# Patient Record
Sex: Male | Born: 1970 | Race: Black or African American | Hispanic: No | Marital: Married | Smoking: Current every day smoker
Health system: Southern US, Community
[De-identification: ages and names within clinical notes are randomized; demographics above are authoritative.]

## PROBLEM LIST (undated history)

## (undated) DIAGNOSIS — G43909 Migraine, unspecified, not intractable, without status migrainosus: Secondary | ICD-10-CM

## (undated) DIAGNOSIS — B019 Varicella without complication: Secondary | ICD-10-CM

## (undated) DIAGNOSIS — I517 Cardiomegaly: Secondary | ICD-10-CM

## (undated) DIAGNOSIS — I499 Cardiac arrhythmia, unspecified: Secondary | ICD-10-CM

## (undated) DIAGNOSIS — I10 Essential (primary) hypertension: Secondary | ICD-10-CM

## (undated) DIAGNOSIS — G4733 Obstructive sleep apnea (adult) (pediatric): Secondary | ICD-10-CM

## (undated) DIAGNOSIS — M1990 Unspecified osteoarthritis, unspecified site: Secondary | ICD-10-CM

## (undated) DIAGNOSIS — E7800 Pure hypercholesterolemia, unspecified: Secondary | ICD-10-CM

## (undated) HISTORY — DX: Migraine, unspecified, not intractable, without status migrainosus: G43.909

## (undated) HISTORY — DX: Cardiac arrhythmia, unspecified: I49.9

## (undated) HISTORY — DX: Varicella without complication: B01.9

## (undated) HISTORY — DX: Pure hypercholesterolemia, unspecified: E78.00

## (undated) HISTORY — DX: Obstructive sleep apnea (adult) (pediatric): G47.33

## (undated) HISTORY — PX: CARPAL TUNNEL RELEASE: SHX101

## (undated) HISTORY — PX: KNEE ARTHROPLASTY: SHX992

## (undated) HISTORY — DX: Unspecified osteoarthritis, unspecified site: M19.90

---

## 2012-03-07 ENCOUNTER — Emergency Department (HOSPITAL_BASED_OUTPATIENT_CLINIC_OR_DEPARTMENT_OTHER)
Admission: EM | Admit: 2012-03-07 | Discharge: 2012-03-08 | Disposition: A | Discharge status: Home / Self Care | Payer: Self-pay | Attending: Emergency Medicine | Admitting: Emergency Medicine

## 2012-03-07 DIAGNOSIS — F17200 Nicotine dependence, unspecified, uncomplicated: Secondary | ICD-10-CM | POA: Insufficient documentation

## 2012-03-07 DIAGNOSIS — X58XXXA Exposure to other specified factors, initial encounter: Secondary | ICD-10-CM | POA: Insufficient documentation

## 2012-03-07 DIAGNOSIS — Y929 Unspecified place or not applicable: Secondary | ICD-10-CM | POA: Insufficient documentation

## 2012-03-07 DIAGNOSIS — Z8679 Personal history of other diseases of the circulatory system: Secondary | ICD-10-CM | POA: Insufficient documentation

## 2012-03-07 DIAGNOSIS — Z79899 Other long term (current) drug therapy: Secondary | ICD-10-CM | POA: Insufficient documentation

## 2012-03-07 DIAGNOSIS — Y9389 Activity, other specified: Secondary | ICD-10-CM | POA: Insufficient documentation

## 2012-03-07 DIAGNOSIS — S025XXA Fracture of tooth (traumatic), initial encounter for closed fracture: Secondary | ICD-10-CM | POA: Insufficient documentation

## 2012-03-07 DIAGNOSIS — K0889 Other specified disorders of teeth and supporting structures: Secondary | ICD-10-CM

## 2012-03-07 DIAGNOSIS — I10 Essential (primary) hypertension: Secondary | ICD-10-CM | POA: Insufficient documentation

## 2012-03-07 HISTORY — DX: Essential (primary) hypertension: I10

## 2012-03-07 HISTORY — DX: Cardiomegaly: I51.7

## 2012-03-07 NOTE — ED Notes (Signed)
Cracked tooth upper left  Now very painful

## 2012-03-08 ENCOUNTER — Encounter (HOSPITAL_BASED_OUTPATIENT_CLINIC_OR_DEPARTMENT_OTHER): Payer: Self-pay | Admitting: Emergency Medicine

## 2012-03-08 MED ORDER — AMOXICILLIN 500 MG PO CAPS: 500.0000 mg | ORAL_CAPSULE | Freq: Three times a day (TID) | ORAL | Status: DC

## 2012-03-08 MED ORDER — HYDROCODONE-ACETAMINOPHEN 5-325 MG PO TABS
2.0000 | ORAL_TABLET | Freq: Once | ORAL | Status: AC
  Administered 2012-03-08: 2 via ORAL
  Filled 2012-03-08: qty 2

## 2012-03-08 MED ORDER — HYDROCODONE-ACETAMINOPHEN 5-325 MG PO TABS: 2.0000 | ORAL_TABLET | ORAL | Status: DC | PRN

## 2012-03-08 NOTE — ED Provider Notes (Signed)
History     CSN: [id number]  Arrival date & time 03/07/12  2330   First MD Initiated Contact with Patient 03/08/12 0001      Chief Complaint  Patient presents with  . Dental Injury    (Consider location/radiation/quality/duration/timing/severity/associated sxs/prior treatment) HPI Comments: 42 year old male with a history of right upper first premolar dental fracture which occurred several months ago. He states that since that time he has been having intermittent pain which is sharp and shooting radiating up into his temple and down into his neck. The pain goes away with Naprosyn but comes back when the Naprosyn wears off. He has had no swelling, no objective fevers, nausea, vomiting, difficulty swallowing, difficulty with speech or breathing. He has contacted a dentist but has not been scheduled for a followup appointment at this time. Currently his symptoms are moderate, worse with palpation  Patient is a 42 y.o. male presenting with dental injury. The history is provided by the patient and the spouse.  Dental Injury    Past Medical History  Diagnosis Date  . Hypertension   . Left ventricular hypertrophy     Past Surgical History  Procedure Date  . Knee arthroplasty     No family history on file.  History  Substance Use Topics  . Smoking status: Current Every Day Smoker  . Smokeless tobacco: Not on file  . Alcohol Use: Yes     Comment: socially      Review of Systems  Constitutional: Negative for fever and chills.  HENT: Positive for dental problem. Negative for sore throat, facial swelling, trouble swallowing and voice change.        Toothache  Gastrointestinal: Negative for nausea and vomiting.    Allergies  Penicillins  Home Medications   Current Outpatient Rx  Name  Route  Sig  Dispense  Refill  . HYDROCHLOROTHIAZIDE 25 MG PO TABS   Oral   Take 25 mg by mouth daily.         . AMOXICILLIN 500 MG PO CAPS   Oral   Take 1 capsule (500 mg total) by  mouth 3 (three) times daily.   30 capsule   0   . HYDROCODONE-ACETAMINOPHEN 5-325 MG PO TABS   Oral   Take 2 tablets by mouth every 4 (four) hours as needed for pain.   10 tablet   0     BP 153/102  Pulse 77  Temp 97.9 F (36.6 C) (Oral)  Resp 20  SpO2 98%  Physical Exam  Nursing note and vitals reviewed. Constitutional: He appears well-developed and well-nourished. No distress.  HENT:  Head: Normocephalic and atraumatic.  Mouth/Throat: Oropharynx is clear and moist. No oropharyngeal exudate.       Dental Disease - several small fractures of the teeth, several missing teeth, right upper first premolar with fracture, no surrounding redness, swelling, mild tenderness over the tooth, no jaw swelling, no tenderness under the tongue  Eyes: Conjunctivae normal are normal. No scleral icterus.  Neck: Normal range of motion. Neck supple. No thyromegaly present.  Cardiovascular: Normal rate and regular rhythm.   Pulmonary/Chest: Effort normal and breath sounds normal.  Lymphadenopathy:    He has no cervical adenopathy.  Neurological: He is alert.  Skin: Skin is warm and dry. No rash noted. He is not diaphoretic.    ED Course  Procedures (including critical care time)  Labs Reviewed - No data to display No results found.   1. Tooth fracture   2. Pain,  dental       MDM  The patient is well-appearing and nontoxic, he has mild hypertension but no fever or tachycardia. He was given pain medication in the form of hydrocodone acetaminophen, amoxicillin for potential infection given the expose route and I have instructed him to follow up very closely with a dentist, to call in the morning for an urgent followup. He states very clearly that he does not have an allergy to amoxicillin. He takes is frequently without any side effects. His wife corroborated this statement.        Vida Roller, MD 03/08/12 [phone]

## 2012-04-25 ENCOUNTER — Emergency Department (HOSPITAL_COMMUNITY)
Admission: EM | Admit: 2012-04-25 | Discharge: 2012-04-26 | Disposition: A | Discharge status: Home / Self Care | Payer: Self-pay | Attending: Emergency Medicine | Admitting: Emergency Medicine

## 2012-04-25 ENCOUNTER — Encounter (HOSPITAL_COMMUNITY): Payer: Self-pay | Admitting: Emergency Medicine

## 2012-04-25 DIAGNOSIS — Z8679 Personal history of other diseases of the circulatory system: Secondary | ICD-10-CM | POA: Insufficient documentation

## 2012-04-25 DIAGNOSIS — F17200 Nicotine dependence, unspecified, uncomplicated: Secondary | ICD-10-CM | POA: Insufficient documentation

## 2012-04-25 DIAGNOSIS — Z79899 Other long term (current) drug therapy: Secondary | ICD-10-CM | POA: Insufficient documentation

## 2012-04-25 DIAGNOSIS — K047 Periapical abscess without sinus: Secondary | ICD-10-CM | POA: Insufficient documentation

## 2012-04-25 DIAGNOSIS — I10 Essential (primary) hypertension: Secondary | ICD-10-CM | POA: Insufficient documentation

## 2012-04-25 MED ORDER — IBUPROFEN 800 MG PO TABS: 800.0000 mg | ORAL_TABLET | Freq: Three times a day (TID) | ORAL | Status: DC | PRN

## 2012-04-25 MED ORDER — HYDROCODONE-ACETAMINOPHEN 5-325 MG PO TABS: 1.0000 | ORAL_TABLET | ORAL | Status: DC | PRN

## 2012-04-25 MED ORDER — CLINDAMYCIN HCL 150 MG PO CAPS: 300.0000 mg | ORAL_CAPSULE | Freq: Four times a day (QID) | ORAL | Status: DC

## 2012-04-25 MED ORDER — HYDROCODONE-ACETAMINOPHEN 5-325 MG PO TABS
2.0000 | ORAL_TABLET | Freq: Once | ORAL | Status: AC
  Administered 2012-04-26: 2 via ORAL
  Filled 2012-04-25: qty 2

## 2012-04-25 NOTE — ED Provider Notes (Signed)
History    This chart was scribed for non-physician practitioner working with Gavin Pound. Oletta Lamas, MD by Leone Payor, ED Scribe. This patient was seen in room WTR5/WTR5 and the patient's care was started at 2245.   CSN: [id number]  Arrival date & time 04/25/12  2245   None     Chief Complaint  Patient presents with  . Dental Pain    The history is provided by the patient. No language interpreter was used.    Justin [name] is a 42 y.o. male who presents to the Emergency Department complaining of ongoing, constant, unchanged dental pain starting 1 week ago. He had a similar problem with the same tooth in January but was unable to follow up with a dentist.  Reports pain is 10/10 currently. He denies trouble swallowing or breathing, chills, fever.   Pt has h/o HTN.  Pt is a current everyday smoker and occasional alcohol user.  Past Medical History  Diagnosis Date  . Hypertension   . Left ventricular hypertrophy     Past Surgical History  Procedure Laterality Date  . Knee arthroplasty      No family history on file.  History  Substance Use Topics  . Smoking status: Current Every Day Smoker  . Smokeless tobacco: Not on file  . Alcohol Use: Yes     Comment: socially      Review of Systems  Constitutional: Negative for fever and chills.  HENT: Positive for dental problem. Negative for sore throat, drooling and trouble swallowing.   Respiratory: Negative for shortness of breath.     Allergies  Penicillins  Home Medications   Current Outpatient Rx  Name  Route  Sig  Dispense  Refill  . amoxicillin (AMOXIL) 500 MG capsule   Oral   Take 1 capsule (500 mg total) by mouth 3 (three) times daily.   30 capsule   0   . hydrochlorothiazide (HYDRODIURIL) 25 MG tablet   Oral   Take 25 mg by mouth daily.         Marland Kitchen HYDROcodone-acetaminophen (NORCO/VICODIN) 5-325 MG per tablet   Oral   Take 2 tablets by mouth every 4 (four) hours as needed for pain.   10 tablet    0     BP 125/75  Pulse 84  Temp(Src) 98 F (36.7 C)  Resp 16  SpO2 96%  Physical Exam  Nursing note and vitals reviewed. Constitutional: He appears well-developed and well-nourished. No distress.  HENT:  Head: Normocephalic and atraumatic.  Mouth/Throat: Uvula is midline and oropharynx is clear and moist.    Neck: Neck supple.  Cardiovascular: Normal rate and regular rhythm.   Pulmonary/Chest: Effort normal and breath sounds normal.  Neurological: He is alert.  Skin: He is not diaphoretic.    ED Course  Procedures (including critical care time)  DIAGNOSTIC STUDIES: Oxygen Saturation is 96% on room air, adequate by my interpretation.    COORDINATION OF CARE: 11:42 PM Discussed treatment plan with pt at bedside and pt agreed to plan.    Labs Reviewed - No data to display No results found.   1. Dental abscess     MDM  Pt with old dental fracture and recurrent infection.  Likely dental abscess.  Pt is afebrile, nontoxic.  D/C with clindamycin, norco, ibuprofen, dental follow up.  Pt advised he absolutely must see dentist.  Discussed diagnosis, treatment plan, and follow up with patient.  Pt given return precautions.  Pt verbalizes understanding and agrees with plan.        ,  I personally performed the services described in this documentation, which was scribed in my presence. The recorded information has been reviewed and is accurate.   Trixie Dredge, PA-C 04/26/12 0002

## 2012-04-25 NOTE — ED Notes (Signed)
Pt c/o dental pain x1 week.  Reports pain 10/10.

## 2012-04-26 NOTE — ED Provider Notes (Signed)
Medical screening examination/treatment/procedure(s) were performed by non-physician practitioner and as supervising physician I was immediately available for consultation/collaboration.  Gavin Pound. Bryndan Bilyk, MD 04/26/12 1454

## 2013-08-12 ENCOUNTER — Ambulatory Visit (INDEPENDENT_AMBULATORY_CARE_PROVIDER_SITE_OTHER): Payer: BC Managed Care – PPO | Admitting: Internal Medicine

## 2013-08-12 ENCOUNTER — Encounter: Payer: Self-pay | Admitting: Internal Medicine

## 2013-08-12 VITALS — BP 112/80 | Temp 98.3°F | Ht 67.5 in | Wt 272.0 lb

## 2013-08-12 DIAGNOSIS — I517 Cardiomegaly: Secondary | ICD-10-CM

## 2013-08-12 DIAGNOSIS — R739 Hyperglycemia, unspecified: Secondary | ICD-10-CM

## 2013-08-12 DIAGNOSIS — R7309 Other abnormal glucose: Secondary | ICD-10-CM

## 2013-08-12 DIAGNOSIS — I10 Essential (primary) hypertension: Secondary | ICD-10-CM

## 2013-08-12 DIAGNOSIS — G4730 Sleep apnea, unspecified: Secondary | ICD-10-CM

## 2013-08-12 DIAGNOSIS — E7800 Pure hypercholesterolemia, unspecified: Secondary | ICD-10-CM

## 2013-08-12 DIAGNOSIS — G4733 Obstructive sleep apnea (adult) (pediatric): Secondary | ICD-10-CM | POA: Insufficient documentation

## 2013-08-12 DIAGNOSIS — G478 Other sleep disorders: Secondary | ICD-10-CM

## 2013-08-12 MED ORDER — HYDROCHLOROTHIAZIDE 25 MG PO TABS: 25.0000 mg | ORAL_TABLET | Freq: Every day | ORAL | Status: DC

## 2013-08-12 MED ORDER — LISINOPRIL 10 MG PO TABS: 10.0000 mg | ORAL_TABLET | Freq: Every day | ORAL | Status: DC

## 2013-08-12 NOTE — Assessment & Plan Note (Signed)
Get records

## 2013-08-12 NOTE — Patient Instructions (Addendum)
Ok to change back but take 10 lisinopril and 25 mg of hctz Check bp readings at least 3 x per week and record . Nutrition  And sleep referral  You will be contacted about this.  Stopping tobacco is good for your health  150 minutes of exercise weeks  ,  Lose weight  To healthy levels. Avoid trans fats and processed foods;  Increase fresh fruits and veges to 5 servings per day. And avoid sweet beverages  Including tea and juice. sugar and sweets make you crave sweets and feel tired. 3500 calories is the energy content of a pound of body weight .Must have a 3500 cal deficit to lose one pound . Thus decrease 500 calorie equivalent per day in food or drink intake / or exercise  for 7 days to lose one pound.  Labs and ROV in about 1-2 months  BRING IN YOUR blood pressure MONITOR to the visit so we can compare readings .and help [place] manage  Medications in the future.     Obesity Obesity is having too much body fat and a body mass index (BMI) of 30 or more. BMI is a number based on your height and weight. The number is an estimate of how much body fat you have. Obesity can happen if you eat more calories than you can burn by exercising or other activity. It can cause major health problems or emergencies.  HOME CARE  Exercise and be active as told by your doctor. Try:  Using stairs when you can.  Parking farther away from store doors.  Gardening, biking, or walking.  Eat healthy foods and drinks that are low in calories. Eat more fruits and vegetables.  Limit fast food, sweets, and snack foods that are made with ingredients that are not natural (processed food).  Eat smaller amounts of food.  Keep a journal and write down what you eat every day. Websites can help with this.  Avoid drinking alcohol. Drink more water and drinks without calories.   Take vitamins and dietary pills (supplements) only as told by your doctor.  Try going to weight-loss support groups or classes to help lessen  stress. Dieticians and counselors may also help. GET HELP RIGHT AWAY IF:  You have chest pain or tightness.  You have trouble breathing or feel short of breath.  You feel weak or have loss of feeling (numbness) in your legs.  You feel confused or have trouble talking.  You have sudden changes in your vision. MAKE SURE YOU:  Understand these instructions.  Will watch your condition.  Will get help right away if you are not doing well or get worse. Document Released: 04/16/2011 Document Reviewed: 04/16/2011 [facility] Patient Information 2015 [place]. This information is not intended to replace advice given to you by your health care provider. Make sure you discuss any questions you have with your health care provider.
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
## 2013-08-13 ENCOUNTER — Telehealth: Payer: Self-pay | Admitting: Internal Medicine

## 2013-08-13 NOTE — Telephone Encounter (Signed)
Relevant patient education mailed to patient.  

## 2013-09-16 ENCOUNTER — Other Ambulatory Visit (INDEPENDENT_AMBULATORY_CARE_PROVIDER_SITE_OTHER): Payer: BC Managed Care – PPO

## 2013-09-16 DIAGNOSIS — IMO0001 Reserved for inherently not codable concepts without codable children: Secondary | ICD-10-CM

## 2013-09-16 DIAGNOSIS — E1165 Type 2 diabetes mellitus with hyperglycemia: Principal | ICD-10-CM

## 2013-09-16 LAB — BASIC METABOLIC PANEL
BUN: 17 mg/dL (ref 6–23)
CALCIUM: 9.9 mg/dL (ref 8.4–10.5)
CO2: 27 mEq/L (ref 19–32)
Chloride: 107 mEq/L (ref 96–112)
Creatinine, Ser: 1.2 mg/dL (ref 0.4–1.5)
GFR: 86.35 mL/min (ref >60.00)
GLUCOSE: 92 mg/dL (ref 70–99)
Potassium: 3.7 mEq/L (ref 3.5–5.1)
SODIUM: 139 meq/L (ref 135–145)

## 2013-09-16 LAB — HEMOGLOBIN A1C: HEMOGLOBIN A1C: 6.2 % (ref 4.6–6.5)

## 2013-09-22 ENCOUNTER — Emergency Department (HOSPITAL_BASED_OUTPATIENT_CLINIC_OR_DEPARTMENT_OTHER)
Admission: EM | Admit: 2013-09-22 | Discharge: 2013-09-23 | Disposition: A | Discharge status: Home / Self Care | Payer: Worker's Compensation | Attending: Emergency Medicine | Admitting: Emergency Medicine

## 2013-09-22 ENCOUNTER — Encounter (HOSPITAL_BASED_OUTPATIENT_CLINIC_OR_DEPARTMENT_OTHER): Payer: Self-pay | Admitting: Emergency Medicine

## 2013-09-22 ENCOUNTER — Emergency Department (HOSPITAL_BASED_OUTPATIENT_CLINIC_OR_DEPARTMENT_OTHER): Payer: Worker's Compensation

## 2013-09-22 DIAGNOSIS — Z79899 Other long term (current) drug therapy: Secondary | ICD-10-CM | POA: Insufficient documentation

## 2013-09-22 DIAGNOSIS — Y9289 Other specified places as the place of occurrence of the external cause: Secondary | ICD-10-CM | POA: Insufficient documentation

## 2013-09-22 DIAGNOSIS — S6980XA Other specified injuries of unspecified wrist, hand and finger(s), initial encounter: Secondary | ICD-10-CM | POA: Diagnosis present

## 2013-09-22 DIAGNOSIS — Z862 Personal history of diseases of the blood and blood-forming organs and certain disorders involving the immune mechanism: Secondary | ICD-10-CM | POA: Diagnosis not present

## 2013-09-22 DIAGNOSIS — Z23 Encounter for immunization: Secondary | ICD-10-CM | POA: Insufficient documentation

## 2013-09-22 DIAGNOSIS — I10 Essential (primary) hypertension: Secondary | ICD-10-CM | POA: Diagnosis not present

## 2013-09-22 DIAGNOSIS — Z880 Allergy status to penicillin: Secondary | ICD-10-CM | POA: Diagnosis not present

## 2013-09-22 DIAGNOSIS — Y9389 Activity, other specified: Secondary | ICD-10-CM | POA: Insufficient documentation

## 2013-09-22 DIAGNOSIS — Z8639 Personal history of other endocrine, nutritional and metabolic disease: Secondary | ICD-10-CM | POA: Insufficient documentation

## 2013-09-22 DIAGNOSIS — F17200 Nicotine dependence, unspecified, uncomplicated: Secondary | ICD-10-CM | POA: Diagnosis not present

## 2013-09-22 DIAGNOSIS — M1710 Unilateral primary osteoarthritis, unspecified knee: Secondary | ICD-10-CM | POA: Insufficient documentation

## 2013-09-22 DIAGNOSIS — S61209A Unspecified open wound of unspecified finger without damage to nail, initial encounter: Secondary | ICD-10-CM | POA: Diagnosis not present

## 2013-09-22 DIAGNOSIS — Y990 Civilian activity done for income or pay: Secondary | ICD-10-CM | POA: Diagnosis not present

## 2013-09-22 DIAGNOSIS — W298XXA Contact with other powered powered hand tools and household machinery, initial encounter: Secondary | ICD-10-CM | POA: Insufficient documentation

## 2013-09-22 DIAGNOSIS — G43909 Migraine, unspecified, not intractable, without status migrainosus: Secondary | ICD-10-CM | POA: Insufficient documentation

## 2013-09-22 DIAGNOSIS — S61431A Puncture wound without foreign body of right hand, initial encounter: Secondary | ICD-10-CM

## 2013-09-22 DIAGNOSIS — Z8619 Personal history of other infectious and parasitic diseases: Secondary | ICD-10-CM | POA: Diagnosis not present

## 2013-09-22 DIAGNOSIS — S6990XA Unspecified injury of unspecified wrist, hand and finger(s), initial encounter: Secondary | ICD-10-CM | POA: Insufficient documentation

## 2013-09-22 DIAGNOSIS — IMO0002 Reserved for concepts with insufficient information to code with codable children: Secondary | ICD-10-CM

## 2013-09-22 NOTE — ED Provider Notes (Signed)
CSN: [id number]     Arrival date & time 09/22/13  2101 History   This chart was scribed for Justin Culton Alfonso Patten, MD by Lowella Petties, ED Scribe. The patient was seen in room MHT13/MHT13. Patient's care was started at 11:55 PM.   Chief Complaint  Patient presents with  . Finger Injury   Patient is a 43 y.o. male presenting with hand pain. The history is provided by the patient. No language interpreter was used.  Hand Pain This is a new problem. The current episode started 6 to 12 hours ago. The problem occurs constantly. The problem has not changed since onset.Pertinent negatives include no chest pain, no abdominal pain, no headaches and no shortness of breath. Nothing aggravates the symptoms. Nothing relieves the symptoms. He has tried nothing for the symptoms. The treatment provided no relief.   HPI Comments: Justin [name] is a 43 y.o. male who presents to the Emergency Department after a drill bit went through the 5th finger on his right hand at 12 hours ago. He reports constant, throbbing, pain in his right 5th finger that he rates as a 6/10. He reports allergy to penicillin.    Past Medical History  Diagnosis Date  . Hypertension     on meds since 1998   . Left ventricular hypertrophy     card work up [facility] ? inc QRS  . Chicken pox     As a child  . Arthritis     Both knees  . Migraines     Triggered by stress/tension  . Arrhythmia   . High cholesterol    Past Surgical History  Procedure Laterality Date  . Knee arthroplasty      left 2013 ligaments    Family History  Problem Relation Age of Onset  . Hypertension Mother   . Other Father     murdered    History  Substance Use Topics  . Smoking status: Current Every Day Smoker  . Smokeless tobacco: Never Used  . Alcohol Use: Yes     Comment: socially    Review of Systems  Respiratory: Negative for shortness of breath.   Cardiovascular: Negative for chest pain.  Gastrointestinal: Negative for abdominal pain.   Skin: Positive for wound.  Neurological: Negative for headaches.  All other systems reviewed and are negative.   Allergies  Penicillins  Home Medications   Prior to Admission medications   Medication Sig Start Date End Date Taking? Authorizing Provider  hydrochlorothiazide (HYDRODIURIL) 25 MG tablet Take 1 tablet (25 mg total) by mouth daily. 08/12/13  Yes Burnis Medin, MD  lisinopril (PRINIVIL,ZESTRIL) 10 MG tablet Take 1 tablet (10 mg total) by mouth daily. 08/12/13  Yes Burnis Medin, MD  HYDROcodone-acetaminophen (NORCO/VICODIN) 5-325 MG per tablet Take 1 tablet by mouth every 4 (four) hours as needed for pain. 04/25/12   Clayton Bibles, PA-C   Triage Vitals: BP 149/93  Pulse 84  Temp(Src) 98.7 F (37.1 C) (Oral)  Resp 18  Ht 5\' 8"  (1.727 m)  Wt 280 lb (127.007 kg)  BMI 42.58 kg/m2  SpO2 99%  Physical Exam  Nursing note and vitals reviewed. Constitutional: He is oriented to person, place, and time. He appears well-developed and well-nourished. No distress.  HENT:  Head: Normocephalic and atraumatic.  Mouth/Throat: Oropharynx is clear and moist.  Eyes: Conjunctivae and EOM are normal. Pupils are equal, round, and reactive to light.  Neck: Normal range of motion. Neck supple. No tracheal deviation present.  Cardiovascular:  Normal rate and intact distal pulses.   Pulmonary/Chest: Effort normal. No respiratory distress.  Abdominal: Soft. Bowel sounds are normal. There is no tenderness. There is no rebound and no guarding.  Musculoskeletal: Normal range of motion. He exhibits no edema.       Right hand: He exhibits normal range of motion, no tenderness, no bony tenderness, normal two-point discrimination, normal capillary refill, no deformity and no swelling. Normal sensation noted. Normal strength noted.       Hands: Puncture wound to the tuft of the right pinkie finger.  Neurological: He is alert and oriented to person, place, and time. He has normal reflexes.  Skin: Skin is  warm and dry.  Psychiatric: He has a normal mood and affect. His behavior is normal.    ED Course  Procedures (including critical care time)  11:59 PM-Discussed treatment plan which includes doxycyline with pt at bedside and pt agreed to plan.   Labs Review Labs Reviewed - No data to display  Imaging Review Dg Finger Little Right  09/22/2013   CLINICAL DATA:  Injured right finger.  EXAM: RIGHT LITTLE FINGER 2+V  COMPARISON:  None.  FINDINGS: The joint spaces are maintained. Moderate degenerative changes at the DIP joint with a large dorsal spur. No acute fracture or radiopaque foreign body.  IMPRESSION: No acute fracture or radiopaque foreign body.   Electronically Signed   By: Kalman Jewels M.D.   On: 09/22/2013 22:02     EKG Interpretation None      MDM   Final diagnoses:  None   Cleansed thoroughly and irrigated wounds are sealed over and scabbed down.    Will not close as is contaminated.  Have started doxycycline BID x 7 days.  No dairy while taking this medication.  Call in am to be seen by Dr. Amedeo Plenty in follow up.  Patient and wife verbalize understanding and to follow up   I personally performed the services described in this documentation, which was scribed in my presence. The recorded information has been reviewed and is accurate.       Cherri Yera Alfonso Patten, MD 09/23/13 0110

## 2013-09-22 NOTE — ED Notes (Addendum)
Pt was at work and a drill bit through his right hand, 5th finger. Bleeding controlled

## 2013-09-23 ENCOUNTER — Encounter: Payer: BC Managed Care – PPO | Attending: Internal Medicine | Admitting: Dietician

## 2013-09-23 ENCOUNTER — Encounter: Payer: Self-pay | Admitting: Dietician

## 2013-09-23 ENCOUNTER — Encounter (HOSPITAL_BASED_OUTPATIENT_CLINIC_OR_DEPARTMENT_OTHER): Payer: Self-pay | Admitting: Emergency Medicine

## 2013-09-23 ENCOUNTER — Encounter: Payer: Self-pay | Admitting: Internal Medicine

## 2013-09-23 ENCOUNTER — Ambulatory Visit (INDEPENDENT_AMBULATORY_CARE_PROVIDER_SITE_OTHER): Payer: BC Managed Care – PPO | Admitting: Internal Medicine

## 2013-09-23 VITALS — BP 120/86 | Temp 98.0°F | Ht 68.0 in | Wt 278.0 lb

## 2013-09-23 DIAGNOSIS — Z713 Dietary counseling and surveillance: Secondary | ICD-10-CM | POA: Diagnosis present

## 2013-09-23 DIAGNOSIS — R739 Hyperglycemia, unspecified: Secondary | ICD-10-CM

## 2013-09-23 DIAGNOSIS — M7661 Achilles tendinitis, right leg: Secondary | ICD-10-CM

## 2013-09-23 DIAGNOSIS — Z6841 Body Mass Index (BMI) 40.0 and over, adult: Secondary | ICD-10-CM | POA: Insufficient documentation

## 2013-09-23 DIAGNOSIS — I10 Essential (primary) hypertension: Secondary | ICD-10-CM

## 2013-09-23 DIAGNOSIS — M7660 Achilles tendinitis, unspecified leg: Secondary | ICD-10-CM

## 2013-09-23 DIAGNOSIS — R7309 Other abnormal glucose: Secondary | ICD-10-CM | POA: Insufficient documentation

## 2013-09-23 DIAGNOSIS — I517 Cardiomegaly: Secondary | ICD-10-CM

## 2013-09-23 MED ORDER — DOXYCYCLINE HYCLATE 100 MG PO TABS
100.0000 mg | ORAL_TABLET | Freq: Once | ORAL | Status: AC
  Administered 2013-09-23: 100 mg via ORAL
  Filled 2013-09-23: qty 1

## 2013-09-23 MED ORDER — TRAMADOL HCL 50 MG PO TABS: 50.0000 mg | ORAL_TABLET | Freq: Four times a day (QID) | ORAL | Status: DC | PRN

## 2013-09-23 MED ORDER — TETANUS-DIPHTH-ACELL PERTUSSIS 5-2.5-18.5 LF-MCG/0.5 IM SUSP
0.5000 mL | Freq: Once | INTRAMUSCULAR | Status: AC
Start: 2013-09-23 — End: 2013-09-23
  Administered 2013-09-23: 0.5 mL via INTRAMUSCULAR
  Filled 2013-09-23: qty 0.5

## 2013-09-23 MED ORDER — IBUPROFEN 800 MG PO TABS
800.0000 mg | ORAL_TABLET | Freq: Once | ORAL | Status: AC
  Administered 2013-09-23: 800 mg via ORAL
  Filled 2013-09-23: qty 1

## 2013-09-23 MED ORDER — MELOXICAM 7.5 MG PO TABS: 7.5000 mg | ORAL_TABLET | Freq: Every day | ORAL | Status: DC

## 2013-09-23 MED ORDER — DOXYCYCLINE HYCLATE 100 MG PO CAPS: 100.0000 mg | ORAL_CAPSULE | Freq: Two times a day (BID) | ORAL | Status: DC

## 2013-09-23 NOTE — Patient Instructions (Signed)
Drink mostly water or water with flavored water or coffee. Eat at least 3 x day (ideally every 3-5 hours you are awake if you hungry).  Have snacks/small meals available such as protein and carbohydrate from the snack list or protein shakes (EAS, Premier, or Atkins), or [employer] bar. Add vegetables throughout the day. Work on on adding exercise in on days off (as much as tolerate without hurting yourself).

## 2013-09-23 NOTE — Progress Notes (Signed)
  Medical Nutrition Therapy:  Appt start time: 0740 end time:  0845.   Assessment:  Primary concerns today: Justin [name] is here today since he is trying to lose weight in order to help with other health problems such as blood sugar, sleep apnea, and blood pressure. Had knee surgery in 10/2011 and weight 215-225 lbs that time and then gained up to 280 lbs in 02/2013. Lost about 15 lbs since then on his own. Stopped eating things such as sweets, eating more fish, and started working a second job since January. Hgb A1c was 6.2% on 09/16/2013   Would like to weight 215 lbs. 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 (Work 7-3 and 4-12:30PM)Lives with his girlfriend and shares the shopping and food preparation. Sleeps about 5 hours per night and is going to get tested for sleep apnea next week. Skips a lot of meals (15 per week) and has days that he does not eat anything about 2 x week. Has been doing that ever since he was a kid. Has days where he does not feel hungry. Eats out about 4 x week.   Preferred Learning Style:   No preference indicated   Learning Readiness:   Ready  MEDICATIONS: see list   DIETARY INTAKE:  Usual eating pattern includes 0-2 meals and 1 snacks per day.  Avoided foods include ketchup, potato salad, grits  24-hr recall:  B ( AM): biscuit with ham with orange juice (or skips) Snk ( AM): none  L ( PM): "up and go" get fish at a restaurant Snk ( PM): none D (9-10 PM): bag of chips  Snk ( PM): none Beverages: 2-3 12 oz cans of soda (used to drink 12), water, or sugar free packets  Usual physical activity: none outside of work  Estimated energy needs: 2200 calories 248 g carbohydrates 165 g protein 61 g fat  Progress Towards Goal(s):  In progress.   Nutritional Diagnosis:  Benson-3.3 Overweight/obesity As related to unstructured meal patterns and excess consumption of soda.  As evidenced by BMI of  42.1 and Hgb A1c of 6.2%.    Intervention:  Nutrition counseling provided. Plan: Drink mostly water or water with flavored water or coffee. Eat at least 3 x day (ideally every 3-5 hours you are awake if you hungry).  Have snacks/small meals available such as protein and carbohydrate from the snack list or protein shakes (EAS, Premier, or Atkins), or [employer] bar. Add vegetables throughout the day. Work on on adding exercise in on days off (as much as tolerate without hurting yourself).  Teaching Method Utilized:  Visual Auditory Hands on  Handouts given during visit include:  Living Well With Diabetes  Yellow Card  15 g CHO Snacks  MyPlate Handout  Barriers to learning/adherence to lifestyle change: frequently skips meals, works a lot of hours  Demonstrated degree of understanding via:  Teach Back   Monitoring/Evaluation:  Dietary intake, exercise, and body weight in 6 week(s).

## 2013-09-23 NOTE — Progress Notes (Signed)
Pre visit review using our clinic review tool, if applicable. No additional management support is needed unless otherwise documented below in the visit note.   Chief Complaint  Patient presents with  . Follow-up    Lab work.  Was seen in the ED last night due to finger injury.    HPI: Justin [name] Here for fu   bp weight labs  Had injury drill puncture oto leright finger last pm got td  BP: readings   137 135    143/93  Forgot monitor cause had injury to right 5th finger  And seen in ed  To see ortho in fu  No problem with meds  Dietary :  Just had session working on it  etoh acass   1  week  1 pack tobacco" Going away. " habvinf months of right heel pain and burning off an on worse aftre  Using new balance shoes no specific injury  . Some ice no hx of some.  noinjury per se  ROS: See pertinent positives and negatives per HPI.No cps ob  Has appt sleep soon for poss sleep apneaeval  Past Medical History  Diagnosis Date  . Hypertension     on meds since 1998   . Left ventricular hypertrophy     card work up [facility] ? inc QRS  . Chicken pox     As a child  . Arthritis     Both knees  . Migraines     Triggered by stress/tension  . Arrhythmia   . High cholesterol     Family History  Problem Relation Age of Onset  . Hypertension Mother   . Other Father     murdered     History   Social History  . Marital Status: Single    Spouse Name: N/A    Number of Children: N/A  . Years of Education: N/A   Social History Main Topics  . Smoking status: Current Every Day Smoker  . Smokeless tobacco: Never Used  . Alcohol Use: Yes     Comment: socially  . Drug Use: No  . Sexual Activity: None   Other Topics Concern  . None   Social History Narrative   8 hours of sleep per night   Lives with his girlfriend and step-daughter.   College/Maintenance Tech/Works full time   [employer]    physically active  Job  Sometimes 54- 90    Tobacco age 86  stopped or a  month .   etoh  less than 76 per week.    Sodas  And sugar sodas  7-8 per day and then came down  To 1 day.    orig from [place] [facility] takes vitamins                   Outpatient Encounter Prescriptions as of 09/23/2013  Medication Sig  . doxycycline (VIBRAMYCIN) 100 MG capsule Take 1 capsule (100 mg total) by mouth 2 (two) times daily. One po bid x 7 days  . hydrochlorothiazide (HYDRODIURIL) 25 MG tablet Take 1 tablet (25 mg total) by mouth daily.  Marland Kitchen lisinopril (PRINIVIL,ZESTRIL) 10 MG tablet Take 1 tablet (10 mg total) by mouth daily.  . meloxicam (MOBIC) 7.5 MG tablet Take 1 tablet (7.5 mg total) by mouth daily.  . traMADol (ULTRAM) 50 MG tablet Take 1 tablet (50 mg total) by mouth every 6 (six) hours as needed.  . [DISCONTINUED] HYDROcodone-acetaminophen (NORCO/VICODIN) 5-325 MG  per tablet Take 1 tablet by mouth every 4 (four) hours as needed for pain.    EXAM:  BP 120/86  Temp(Src) 98 F (36.7 C) (Oral)  Ht 5\' 8"  (1.727 m)  Wt 278 lb (126.1 kg)  BMI 42.28 kg/m2 134/84 Body mass index is 42.28 kg/(m^2). Wt Readings from Last 3 Encounters:  09/23/13 278 lb (126.1 kg)  09/23/13 277 lb 1.6 oz (125.692 kg)  09/22/13 280 lb (127.007 kg)    GENERAL: vitals reviewed and listed above, alert, oriented, appears well hydrated and in no acute distressr hand fingers  in splint wrap  HEENT: atraumatic, conjunctiva  clear, no obvious abnormalities on inspection of external nose and ears O NECK: no obvious masses on inspection palpation  LUNGS: clear to auscultation bilaterally, no wheezes, rales or rhonchi, good air movement CV: HRRR, no clubbing cyanosis  nl cap refill  Right achilles mild swelling and tender  No crepitus or redness PSYCH: pleasant and cooperative, no obvious depression or anxiety Lab Results  Component Value Date   GLUCOSE 92 09/16/2013   NA 139 09/16/2013   K 3.7 09/16/2013   CL 107 09/16/2013   CREATININE 1.2 09/16/2013   BUN 17 09/16/2013   CO2 27  09/16/2013   HGBA1C 6.2 09/16/2013   reviewed   ASSESSMENT AND PLAN:  Discussed the following assessment and plan:  Essential hypertension - stable   Left ventricular hypertrophy  Achilles tendinitis of right lower extremity - local care consider see sm or otho  Hyperglycemia - not diabetic at this time butr a1c over 6 follow  up in 3-4 months lsi   -Patient advised to return or notify health care team  if symptoms worsen ,persist or new concerns arise.  Patient Instructions  bp is better   Intensify lifestyle interventions. As you are doing.  Ankle seems like achilles tendinitis   Achilles Tendinitis Achilles tendinitis is inflammation of the tough, cord-like band that attaches the lower muscles of your leg to your heel (Achilles tendon). It is usually caused by overusing the tendon and joint involved.  CAUSES Achilles tendinitis can happen because of:  A sudden increase in exercise or activity (such as running).  Doing the same exercises or activities (such as jumping) over and over.  Not warming up calf muscles before exercising.  Exercising in shoes that are worn out or not made for exercise.  Having arthritis or a bone growth on the back of the heel bone. This can rub against the tendon and hurt the tendon. SIGNS AND SYMPTOMS The most common symptoms are:  Pain in the back of the leg, just above the heel. The pain usually gets worse with exercise and better with rest.  Stiffness or soreness in the back of the leg, especially in the morning.  Swelling of the skin over the Achilles tendon.  Trouble standing on tiptoe. Sometimes, an Achilles tendon tears (ruptures). Symptoms of an Achilles tendon rupture can include:  Sudden, severe pain in the back of the leg.  Trouble putting weight on the foot or walking normally. DIAGNOSIS Achilles tendinitis will be diagnosed based on symptoms and a physical examination. An X-ray may be done to check if another condition is  causing your symptoms. An MRI may be ordered if your health care provider suspects you may have completely torn your tendon, which is called an Achilles tendon rupture.  TREATMENT  Achilles tendinitis usually gets better over time. It can take weeks to months to heal completely. Treatment focuses  on treating the symptoms and helping the injury heal. [place] your Achilles tendon and avoid activities that cause pain.  Apply ice to the injured area:  Put ice in a plastic bag.  Place a towel between your skin and the bag.  Leave the ice on for 20 minutes, 2-3 times a day  Try to avoid using the tendon (other than gentle range of motion) while the tendon is painful. Do not resume use until instructed by your health care provider. Then begin use gradually. Do not increase use to the point of pain. If pain does develop, decrease use and continue the above measures. Gradually increase activities that do not cause discomfort until you achieve normal use.  Do exercises to make your calf muscles stronger and more flexible. Your health care provider or physical therapist can recommend exercises for you to do.  Wrap your ankle with an elastic bandage or other wrap. This can help keep your tendon from moving too much. Your health care provider will show you how to wrap your ankle correctly.  Only take over-the-counter or prescription medicines for pain, discomfort, or fever as directed by your health care provider. SEEK MEDICAL CARE IF:   Your pain and swelling increase or pain is uncontrolled with medicines.  You develop new, unexplained symptoms or your symptoms get worse.  You are unable to move your toes or foot.  You develop warmth and swelling in your foot.  You have an unexplained temperature. MAKE SURE YOU:   Understand these instructions.  Will watch your condition.  Will get help right away if you are not doing well or get worse. Document Released: 11/01/2004  Document Revised: 11/12/2012 Document Reviewed: 09/03/2012 [facility] Patient Information 2015 [place], [place]. This information is not intended to replace advice given to you by your health care provider. Make sure you discuss any questions you have with your health care provider.      Standley Brooking. Panosh M.D.

## 2013-09-23 NOTE — ED Notes (Signed)
UDS completed 

## 2013-09-23 NOTE — Patient Instructions (Signed)
bp is better   Intensify lifestyle interventions. As you are doing.  Ankle seems like achilles tendinitis   Achilles Tendinitis Achilles tendinitis is inflammation of the tough, cord-like band that attaches the lower muscles of your leg to your heel (Achilles tendon). It is usually caused by overusing the tendon and joint involved.  CAUSES Achilles tendinitis can happen because of:  A sudden increase in exercise or activity (such as running).  Doing the same exercises or activities (such as jumping) over and over.  Not warming up calf muscles before exercising.  Exercising in shoes that are worn out or not made for exercise.  Having arthritis or a bone growth on the back of the heel bone. This can rub against the tendon and hurt the tendon. SIGNS AND SYMPTOMS The most common symptoms are:  Pain in the back of the leg, just above the heel. The pain usually gets worse with exercise and better with rest.  Stiffness or soreness in the back of the leg, especially in the morning.  Swelling of the skin over the Achilles tendon.  Trouble standing on tiptoe. Sometimes, an Achilles tendon tears (ruptures). Symptoms of an Achilles tendon rupture can include:  Sudden, severe pain in the back of the leg.  Trouble putting weight on the foot or walking normally. DIAGNOSIS Achilles tendinitis will be diagnosed based on symptoms and a physical examination. An X-ray may be done to check if another condition is causing your symptoms. An MRI may be ordered if your health care provider suspects you may have completely torn your tendon, which is called an Achilles tendon rupture.  TREATMENT  Achilles tendinitis usually gets better over time. It can take weeks to months to heal completely. Treatment focuses on treating the symptoms and helping the injury heal. HOME CARE INSTRUCTIONS   Rest your Achilles tendon and avoid activities that cause pain.  Apply ice to the injured area:  Put ice in a  plastic bag.  Place a towel between your skin and the bag.  Leave the ice on for 20 minutes, 2-3 times a day  Try to avoid using the tendon (other than gentle range of motion) while the tendon is painful. Do not resume use until instructed by your health care provider. Then begin use gradually. Do not increase use to the point of pain. If pain does develop, decrease use and continue the above measures. Gradually increase activities that do not cause discomfort until you achieve normal use.  Do exercises to make your calf muscles stronger and more flexible. Your health care provider or physical therapist can recommend exercises for you to do.  Wrap your ankle with an elastic bandage or other wrap. This can help keep your tendon from moving too much. Your health care provider will show you how to wrap your ankle correctly.  Only take over-the-counter or prescription medicines for pain, discomfort, or fever as directed by your health care provider. SEEK MEDICAL CARE IF:   Your pain and swelling increase or pain is uncontrolled with medicines.  You develop new, unexplained symptoms or your symptoms get worse.  You are unable to move your toes or foot.  You develop warmth and swelling in your foot.  You have an unexplained temperature. MAKE SURE YOU:   Understand these instructions.  Will watch your condition.  Will get help right away if you are not doing well or get worse. Document Released: 11/01/2004 Document Revised: 11/12/2012 Document Reviewed: 09/03/2012 [facility] Patient Information 2015 [place], [place].  This information is not intended to replace advice given to you by your health care provider. Make sure you discuss any questions you have with your health care provider.

## 2013-09-23 NOTE — Discharge Instructions (Signed)

## 2013-09-28 ENCOUNTER — Encounter: Payer: Self-pay | Admitting: Pulmonary Disease

## 2013-09-28 ENCOUNTER — Ambulatory Visit (INDEPENDENT_AMBULATORY_CARE_PROVIDER_SITE_OTHER): Payer: BC Managed Care – PPO | Admitting: Pulmonary Disease

## 2013-09-28 VITALS — BP 122/72 | HR 99 | Temp 98.2°F | Ht 68.0 in | Wt 276.2 lb

## 2013-09-28 DIAGNOSIS — G4730 Sleep apnea, unspecified: Secondary | ICD-10-CM

## 2013-09-28 DIAGNOSIS — G478 Other sleep disorders: Secondary | ICD-10-CM

## 2013-09-28 DIAGNOSIS — G4733 Obstructive sleep apnea (adult) (pediatric): Secondary | ICD-10-CM

## 2013-09-28 NOTE — Patient Instructions (Signed)
Will schedule for home sleep testing.  Will arrange followup once the results are available. Work on weight loss

## 2013-09-28 NOTE — Progress Notes (Signed)
   Subjective:    Patient ID: Justin [name], male    DOB: 10-Jul-1970, 43 y.o.   MRN: [id number]  HPI The patient is a 43 year old male who I've been asked to see for possible obstructive sleep apnea. He has been noted to have loud snoring as well as witnessed apneas by his bed partner, and admits to having gasping arousals. He has frequent awakenings at night, and is rested only 50% of the mornings upon arising. He does note sleep pressure during the day with inactivity, but has no problems sitting down to watch a movie or television in the evening. He does have sleepiness with driving longer distances. The patient's weight is up over 50 pounds in the last 2 years, and his Epworth score today is 14.   Sleep Questionnaire What time do you typically go to bed?( Between what hours) 10-11:15pm 10-11:15pm at 1514 on 09/28/13 by Lilli Few, CMA How long does it take you to fall asleep? 5 mins 5 mins at 1514 on 09/28/13 by Lilli Few, CMA How many times during the night do you wake up? 5 5 at 1514 on 09/28/13 by Lilli Few, CMA What time do you get out of bed to start your day? [id number] at 1514 on 09/28/13 by Lilli Few, CMA Do you drive or operate heavy machinery in your occupation? No No at 1514 on 09/28/13 by Lilli Few, CMA How much has your weight changed (up or down) over the past two years? (In pounds) 50 lb (22.68 kg) 50 lb (22.68 kg) at 1514 on 09/28/13 by Lilli Few, CMA Have you ever had a sleep study before? No No at 1514 on 09/28/13 by Lilli Few, CMA Do you currently use CPAP? No No at 1514 on 09/28/13 by Lilli Few, CMA Do you wear oxygen at any time? No    Review of Systems  Constitutional: Negative for fever and unexpected weight change.  HENT: Positive for congestion. Negative for dental problem, ear pain, nosebleeds, postnasal drip, rhinorrhea, sinus pressure, sneezing, sore throat and  trouble swallowing.   Eyes: Negative for redness and itching.  Respiratory: Positive for shortness of breath. Negative for cough, chest tightness and wheezing.   Cardiovascular: Positive for leg swelling. Negative for palpitations.  Gastrointestinal: Negative for nausea and vomiting.  Genitourinary: Negative for dysuria.  Musculoskeletal: Negative for joint swelling.  Skin: Negative for rash.  Neurological: Positive for headaches.  Hematological: Does not bruise/bleed easily.  Psychiatric/Behavioral: Negative for dysphoric mood. The patient is not nervous/anxious.        Objective:   Physical Exam Constitutional:  Obese male, no acute distress  HENT:  Nares patent without discharge, enlarged turbinates  Oropharynx without exudate, palate and uvula are moderately elongated, large tongue.   Eyes:  Perrla, eomi, no scleral icterus  Neck:  No JVD, no TMG  Cardiovascular:  Normal rate, regular rhythm, no rubs or gallops.  No murmurs        Intact distal pulses  Pulmonary :  Normal breath sounds, no stridor or respiratory distress   No rales, rhonchi, or wheezing  Abdominal:  Soft, nondistended, bowel sounds present.  No tenderness noted.   Musculoskeletal:  mild lower extremity edema noted.  Lymph Nodes:  No cervical lymphadenopathy noted  Skin:  No cyanosis noted  Neurologic:  Alert, appropriate, moves all 4 extremities without obvious deficit.         Assessment & Plan:

## 2013-09-28 NOTE — Assessment & Plan Note (Signed)
The patient's history is most consistent with clinically significant obstructive sleep apnea. I have had a long discussion with him about the pathophysiology of sleep disordered breathing, including its impact to quality of life and cardiovascular health. I think he needs to have a sleep study done for diagnosis, and is an excellent candidate for home sleep testing. The patient is agreeable to this approach.

## 2013-10-18 ENCOUNTER — Encounter (HOSPITAL_BASED_OUTPATIENT_CLINIC_OR_DEPARTMENT_OTHER): Payer: Self-pay | Admitting: Emergency Medicine

## 2013-10-18 ENCOUNTER — Emergency Department (HOSPITAL_BASED_OUTPATIENT_CLINIC_OR_DEPARTMENT_OTHER)
Admission: EM | Admit: 2013-10-18 | Discharge: 2013-10-18 | Disposition: A | Discharge status: Home / Self Care | Payer: BC Managed Care – PPO | Attending: Emergency Medicine | Admitting: Emergency Medicine

## 2013-10-18 DIAGNOSIS — Z862 Personal history of diseases of the blood and blood-forming organs and certain disorders involving the immune mechanism: Secondary | ICD-10-CM | POA: Diagnosis not present

## 2013-10-18 DIAGNOSIS — Z791 Long term (current) use of non-steroidal anti-inflammatories (NSAID): Secondary | ICD-10-CM | POA: Diagnosis not present

## 2013-10-18 DIAGNOSIS — M1710 Unilateral primary osteoarthritis, unspecified knee: Secondary | ICD-10-CM | POA: Diagnosis not present

## 2013-10-18 DIAGNOSIS — Z8619 Personal history of other infectious and parasitic diseases: Secondary | ICD-10-CM | POA: Insufficient documentation

## 2013-10-18 DIAGNOSIS — H9209 Otalgia, unspecified ear: Secondary | ICD-10-CM | POA: Diagnosis present

## 2013-10-18 DIAGNOSIS — H65119 Acute and subacute allergic otitis media (mucoid) (sanguinous) (serous), unspecified ear: Secondary | ICD-10-CM | POA: Diagnosis not present

## 2013-10-18 DIAGNOSIS — Z8639 Personal history of other endocrine, nutritional and metabolic disease: Secondary | ICD-10-CM | POA: Diagnosis not present

## 2013-10-18 DIAGNOSIS — G43909 Migraine, unspecified, not intractable, without status migrainosus: Secondary | ICD-10-CM | POA: Diagnosis not present

## 2013-10-18 DIAGNOSIS — H65199 Other acute nonsuppurative otitis media, unspecified ear: Secondary | ICD-10-CM | POA: Insufficient documentation

## 2013-10-18 DIAGNOSIS — Z880 Allergy status to penicillin: Secondary | ICD-10-CM | POA: Insufficient documentation

## 2013-10-18 DIAGNOSIS — Z79899 Other long term (current) drug therapy: Secondary | ICD-10-CM | POA: Insufficient documentation

## 2013-10-18 DIAGNOSIS — IMO0002 Reserved for concepts with insufficient information to code with codable children: Secondary | ICD-10-CM

## 2013-10-18 DIAGNOSIS — I10 Essential (primary) hypertension: Secondary | ICD-10-CM | POA: Insufficient documentation

## 2013-10-18 DIAGNOSIS — H65112 Acute and subacute allergic otitis media (mucoid) (sanguinous) (serous), left ear: Secondary | ICD-10-CM

## 2013-10-18 LAB — RAPID STREP SCREEN (MED CTR MEBANE ONLY): Streptococcus, Group A Screen (Direct): NEGATIVE

## 2013-10-18 MED ORDER — HYDROCODONE-ACETAMINOPHEN 5-325 MG PO TABS: 2.0000 | ORAL_TABLET | ORAL | Status: DC | PRN

## 2013-10-18 MED ORDER — AMOXICILLIN 500 MG PO CAPS
500.0000 mg | ORAL_CAPSULE | Freq: Once | ORAL | Status: AC
  Administered 2013-10-18: 500 mg via ORAL
  Filled 2013-10-18: qty 1

## 2013-10-18 MED ORDER — AMOXICILLIN 500 MG PO CAPS: 500.0000 mg | ORAL_CAPSULE | Freq: Three times a day (TID) | ORAL | Status: AC

## 2013-10-18 MED ORDER — HYDROCODONE-ACETAMINOPHEN 5-325 MG PO TABS
2.0000 | ORAL_TABLET | Freq: Once | ORAL | Status: AC
  Administered 2013-10-18: 2 via ORAL
  Filled 2013-10-18: qty 2

## 2013-10-18 NOTE — ED Provider Notes (Signed)
CSN: [id number]     Arrival date & time 10/18/13  1844 History   First MD Initiated Contact with Patient 10/18/13 1921     Chief Complaint  Patient presents with  . Otalgia     (Consider location/radiation/quality/duration/timing/severity/associated sxs/prior Treatment) Patient is a 43 y.o. Justin [name] presenting with ear pain. The history is provided by the patient. No language interpreter was used.  Otalgia Location:  Left Quality:  Aching Severity:  Severe Onset quality:  Gradual Duration:  3 days Timing:  Constant Progression:  Worsening Chronicity:  New Relieved by:  Nothing Worsened by:  Nothing tried Ineffective treatments:  None tried Associated symptoms: no ear discharge and no sore throat   Risk factors: recent travel     Past Medical History  Diagnosis Date  . Hypertension     on meds since 1998   . Left ventricular hypertrophy     card work up [facility] ? inc QRS  . Chicken pox     As a child  . Arthritis     Both knees  . Migraines     Triggered by stress/tension  . Arrhythmia   . High cholesterol    Past Surgical History  Procedure Laterality Date  . Knee arthroplasty      left 2013 ligaments    Family History  Problem Relation Age of Onset  . Hypertension Mother   . Other Father     murdered    History  Substance Use Topics  . Smoking status: Current Every Day Smoker -- 0.25 packs/day for 30 years    Types: Cigarettes  . Smokeless tobacco: Never Used  . Alcohol Use: Yes     Comment: socially    Review of Systems  HENT: Positive for ear pain. Negative for ear discharge and sore throat.   All other systems reviewed and are negative.     Allergies  Penicillins  Home Medications   Prior to Admission medications   Medication Sig Start Date End Date Taking? Authorizing Provider  doxycycline (VIBRAMYCIN) 100 MG capsule Take 1 capsule (100 mg total) by mouth 2 (two) times daily. One po bid x 7 days 09/23/13   April K Palumbo-Rasch, MD   hydrochlorothiazide (HYDRODIURIL) 25 MG tablet Take 1 tablet (25 mg total) by mouth daily. 08/12/13   Burnis Medin, MD  lisinopril (PRINIVIL,ZESTRIL) 10 MG tablet Take 1 tablet (10 mg total) by mouth daily. 08/12/13   Burnis Medin, MD  meloxicam (MOBIC) 7.5 MG tablet Take 1 tablet (7.5 mg total) by mouth daily. 09/23/13   April K Palumbo-Rasch, MD  traMADol (ULTRAM) 50 MG tablet Take 1 tablet (50 mg total) by mouth every 6 (six) hours as needed. 09/23/13   April K Palumbo-Rasch, MD   BP 134/74  Pulse 85  Temp(Src) 99 F (37.2 C) (Oral)  Resp 19  SpO2 99% Physical Exam  Nursing note and vitals reviewed. Constitutional: He is oriented to person, place, and time. He appears well-developed and well-nourished.  HENT:  Head: Normocephalic.  Eyes: EOM are normal. Pupils are equal, round, and reactive to light.  Neck: Normal range of motion.  Cardiovascular: Normal rate and normal heart sounds.   Pulmonary/Chest: Effort normal.  Abdominal: Soft. He exhibits no distension.  Musculoskeletal: Normal range of motion.  Neurological: He is alert and oriented to person, place, and time.  Skin: Skin is warm.  Psychiatric: He has a normal mood and affect.    ED Course  Procedures (including critical care time)  Labs Review Labs Reviewed  RAPID STREP SCREEN    Imaging Review No results found.   EKG Interpretation None      MDM   Final diagnoses:  Acute mucoid otitis media of left ear    amoxicilliabn Hydrocodone    Fransico Meadow, PA-C 10/18/13 1938

## 2013-10-18 NOTE — ED Notes (Signed)
Patient c/o sore throat & L side ear ache for the past three days. Hurts to swallow

## 2013-10-18 NOTE — Discharge Instructions (Signed)

## 2013-10-20 LAB — CULTURE, GROUP A STREP

## 2013-10-23 NOTE — ED Provider Notes (Signed)
Medical screening examination/treatment/procedure(s) were performed by non-physician practitioner and as supervising physician I was immediately available for consultation/collaboration.   EKG Interpretation None        Tanna Furry, MD 10/23/13 0005

## 2013-10-27 ENCOUNTER — Ambulatory Visit: Payer: BC Managed Care – PPO | Admitting: Family Medicine

## 2013-10-28 ENCOUNTER — Ambulatory Visit: Payer: BC Managed Care – PPO | Admitting: Family Medicine

## 2013-10-28 DIAGNOSIS — Z0289 Encounter for other administrative examinations: Secondary | ICD-10-CM

## 2013-11-04 ENCOUNTER — Ambulatory Visit: Payer: Self-pay | Admitting: Dietician

## 2013-11-11 DIAGNOSIS — G4733 Obstructive sleep apnea (adult) (pediatric): Secondary | ICD-10-CM

## 2013-11-12 ENCOUNTER — Telehealth: Payer: Self-pay | Admitting: Pulmonary Disease

## 2013-11-12 DIAGNOSIS — G4733 Obstructive sleep apnea (adult) (pediatric): Secondary | ICD-10-CM

## 2013-11-12 NOTE — Telephone Encounter (Signed)
Called pt. He is scheduled to come in 11/16/13 to see [facility]. Nothing further needed

## 2013-11-12 NOTE — Telephone Encounter (Signed)
Needs ov to review sleep study 

## 2013-11-13 ENCOUNTER — Encounter: Payer: Self-pay | Admitting: Pulmonary Disease

## 2013-11-14 ENCOUNTER — Encounter (HOSPITAL_BASED_OUTPATIENT_CLINIC_OR_DEPARTMENT_OTHER): Payer: Self-pay | Admitting: Emergency Medicine

## 2013-11-14 ENCOUNTER — Emergency Department (HOSPITAL_BASED_OUTPATIENT_CLINIC_OR_DEPARTMENT_OTHER)
Admission: EM | Admit: 2013-11-14 | Discharge: 2013-11-14 | Disposition: A | Discharge status: Home / Self Care | Payer: BC Managed Care – PPO | Attending: Emergency Medicine | Admitting: Emergency Medicine

## 2013-11-14 ENCOUNTER — Emergency Department (HOSPITAL_BASED_OUTPATIENT_CLINIC_OR_DEPARTMENT_OTHER): Payer: BC Managed Care – PPO

## 2013-11-14 DIAGNOSIS — I10 Essential (primary) hypertension: Secondary | ICD-10-CM | POA: Insufficient documentation

## 2013-11-14 DIAGNOSIS — Z8619 Personal history of other infectious and parasitic diseases: Secondary | ICD-10-CM | POA: Diagnosis not present

## 2013-11-14 DIAGNOSIS — M5441 Lumbago with sciatica, right side: Secondary | ICD-10-CM | POA: Insufficient documentation

## 2013-11-14 DIAGNOSIS — Z720 Tobacco use: Secondary | ICD-10-CM | POA: Diagnosis not present

## 2013-11-14 DIAGNOSIS — Z8639 Personal history of other endocrine, nutritional and metabolic disease: Secondary | ICD-10-CM | POA: Diagnosis not present

## 2013-11-14 DIAGNOSIS — R109 Unspecified abdominal pain: Secondary | ICD-10-CM | POA: Diagnosis not present

## 2013-11-14 DIAGNOSIS — Z880 Allergy status to penicillin: Secondary | ICD-10-CM | POA: Insufficient documentation

## 2013-11-14 DIAGNOSIS — Z79899 Other long term (current) drug therapy: Secondary | ICD-10-CM | POA: Insufficient documentation

## 2013-11-14 DIAGNOSIS — Z792 Long term (current) use of antibiotics: Secondary | ICD-10-CM | POA: Diagnosis not present

## 2013-11-14 DIAGNOSIS — M1990 Unspecified osteoarthritis, unspecified site: Secondary | ICD-10-CM | POA: Insufficient documentation

## 2013-11-14 DIAGNOSIS — M545 Low back pain: Secondary | ICD-10-CM | POA: Diagnosis present

## 2013-11-14 LAB — URINALYSIS, ROUTINE W REFLEX MICROSCOPIC
Bilirubin Urine: NEGATIVE
GLUCOSE, UA: NEGATIVE mg/dL
Hgb urine dipstick: NEGATIVE
KETONES UR: NEGATIVE mg/dL
LEUKOCYTES UA: NEGATIVE
Nitrite: NEGATIVE
PH: 5.5 (ref 5.0–8.0)
Protein, ur: NEGATIVE mg/dL
Specific Gravity, Urine: 1.021 (ref 1.005–1.030)
Urobilinogen, UA: 0.2 mg/dL (ref 0.0–1.0)

## 2013-11-14 MED ORDER — ONDANSETRON HCL 4 MG/2ML IJ SOLN
4.0000 mg | Freq: Once | INTRAMUSCULAR | Status: AC
  Administered 2013-11-14: 4 mg via INTRAMUSCULAR
  Filled 2013-11-14: qty 2

## 2013-11-14 MED ORDER — HYDROCODONE-ACETAMINOPHEN 5-325 MG PO TABS: 2.0000 | ORAL_TABLET | ORAL | Status: DC | PRN

## 2013-11-14 MED ORDER — IBUPROFEN 800 MG PO TABS: 800.0000 mg | ORAL_TABLET | Freq: Three times a day (TID) | ORAL | Status: DC

## 2013-11-14 MED ORDER — HYDROMORPHONE HCL 1 MG/ML IJ SOLN
2.0000 mg | Freq: Once | INTRAMUSCULAR | Status: AC
  Administered 2013-11-14: 2 mg via INTRAMUSCULAR
  Filled 2013-11-14: qty 2

## 2013-11-14 MED ORDER — METHOCARBAMOL 500 MG PO TABS: 500.0000 mg | ORAL_TABLET | Freq: Two times a day (BID) | ORAL | Status: DC

## 2013-11-14 NOTE — ED Provider Notes (Signed)
History/physical exam/procedure(s) were performed by non-physician practitioner and as supervising physician I was immediately available for consultation/collaboration. I have reviewed all notes and am in agreement with care and plan.   Shaune Pollack, MD 11/14/13 [id number]

## 2013-11-14 NOTE — ED Notes (Signed)
Patient asked for drink, Justin [name] approved drink. I also asked patient for urine asap, patient to give sample by using bedside urine jug. Spouse at bedside will notify staff when urine ready.

## 2013-11-14 NOTE — ED Provider Notes (Signed)
CSN: [id number]     Arrival date & time 11/14/13  1905 History   First MD Initiated Contact with Patient 11/14/13 2001     Chief Complaint  Patient presents with  . Back Pain     (Consider location/radiation/quality/duration/timing/severity/associated sxs/prior Treatment) Patient is a 43 y.o. male presenting with back pain. The history is provided by the patient. No language interpreter was used.  Back Pain Location:  Lumbar spine Quality:  Aching Radiates to:  Does not radiate Pain severity:  Moderate Pain is:  Same all the time Onset quality:  Gradual Timing:  Constant Progression:  Worsening Chronicity:  New Context: not recent illness   Relieved by:  Nothing Ineffective treatments:  None tried Associated symptoms: abdominal pain     Past Medical History  Diagnosis Date  . Hypertension     on meds since 1998   . Left ventricular hypertrophy     card work up [facility] ? inc QRS  . Chicken pox     As a child  . Arthritis     Both knees  . Migraines     Triggered by stress/tension  . Arrhythmia   . High cholesterol    Past Surgical History  Procedure Laterality Date  . Knee arthroplasty      left 2013 ligaments    Family History  Problem Relation Age of Onset  . Hypertension Mother   . Other Father     murdered    History  Substance Use Topics  . Smoking status: Current Every Day Smoker -- 0.25 packs/day for 30 years    Types: Cigarettes  . Smokeless tobacco: Never Used  . Alcohol Use: Yes     Comment: socially    Review of Systems  Gastrointestinal: Positive for abdominal pain.  Musculoskeletal: Positive for back pain.  All other systems reviewed and are negative.     Allergies  Penicillins  Home Medications   Prior to Admission medications   Medication Sig Start Date End Date Taking? Authorizing Provider  doxycycline (VIBRAMYCIN) 100 MG capsule Take 1 capsule (100 mg total) by mouth 2 (two) times daily. One po bid x 7 days 09/23/13   April K  Palumbo-Rasch, MD  hydrochlorothiazide (HYDRODIURIL) 25 MG tablet Take 1 tablet (25 mg total) by mouth daily. 08/12/13   Burnis Medin, MD  HYDROcodone-acetaminophen (NORCO/VICODIN) 5-325 MG per tablet Take 2 tablets by mouth every 4 (four) hours as needed. 10/18/13   Fransico Meadow, PA-C  lisinopril (PRINIVIL,ZESTRIL) 10 MG tablet Take 1 tablet (10 mg total) by mouth daily. 08/12/13   Burnis Medin, MD   BP 139/98  Pulse 83  Temp(Src) 98.2 F (36.8 C) (Oral)  Resp 18  Wt 276 lb (125.193 kg)  SpO2 99% Physical Exam  Nursing note and vitals reviewed. Constitutional: He is oriented to person, place, and time. He appears well-developed and well-nourished.  HENT:  Head: Normocephalic.  Eyes: EOM are normal. Pupils are equal, round, and reactive to light.  Neck: Normal range of motion.  Cardiovascular: Normal rate.   Pulmonary/Chest: Effort normal.  Abdominal: Soft. He exhibits no distension.  Genitourinary:  Testicles nontender  Musculoskeletal:  Tender lumbar spine,    Neurological: He is alert and oriented to person, place, and time.  Skin: Skin is warm.  Psychiatric: He has a normal mood and affect.    ED Course  Procedures (including critical care time) Labs Review Labs Reviewed - No data to display  Imaging Review No results  found.   EKG Interpretation None      MDM   Final diagnoses:  Right flank pain    Hydrocodone Ibuprofen Robaxin  See Dr. Regis Bill for recheck this week    Fransico Meadow, PA-C 11/14/13 2259

## 2013-11-14 NOTE — Discharge Instructions (Signed)
Back Pain, Adult Low back pain is very common. About 1 in 5 people have back pain.The cause of low back pain is rarely dangerous. The pain often gets better over time.About half of people with a sudden onset of back pain feel better in just 2 weeks. About 8 in 10 people feel better by 6 weeks.  CAUSES Some common causes of back pain include:  Strain of the muscles or ligaments supporting the spine.  Wear and tear (degeneration) of the spinal discs.  Arthritis.  Direct injury to the back. DIAGNOSIS Most of the time, the direct cause of low back pain is not known.However, back pain can be treated effectively even when the exact cause of the pain is unknown.Answering your caregiver's questions about your overall health and symptoms is one of the most accurate ways to make sure the cause of your pain is not dangerous. If your caregiver needs more information, he or she may order lab work or imaging tests (X-rays or MRIs).However, even if imaging tests show changes in your back, this usually does not require surgery. HOME CARE INSTRUCTIONS For many people, back pain returns.Since low back pain is rarely dangerous, it is often a condition that people can learn to manageon their own.   Remain active. It is stressful on the back to sit or stand in one place. Do not sit, drive, or stand in one place for more than 30 minutes at a time. Take short walks on level surfaces as soon as pain allows.Try to increase the length of time you walk each day.  Do not stay in bed.Resting more than 1 or 2 days can delay your recovery.  Do not avoid exercise or work.Your body is made to move.It is not dangerous to be active, even though your back may hurt.Your back will likely heal faster if you return to being active before your pain is gone.  Pay attention to your body when you bend and lift. Many people have less discomfortwhen lifting if they bend their knees, keep the load close to their bodies,and  avoid twisting. Often, the most comfortable positions are those that put less stress on your recovering back.  Find a comfortable position to sleep. Use a firm mattress and lie on your side with your knees slightly bent. If you lie on your back, put a pillow under your knees.  Only take over-the-counter or prescription medicines as directed by your caregiver. Over-the-counter medicines to reduce pain and inflammation are often the most helpful.Your caregiver may prescribe muscle relaxant drugs.These medicines help dull your pain so you can more quickly return to your normal activities and healthy exercise.  Put ice on the injured area.  Put ice in a plastic bag.  Place a towel between your skin and the bag.  Leave the ice on for 15-20 minutes, 03-04 times a day for the first 2 to 3 days. After that, ice and heat may be alternated to reduce pain and spasms.  Ask your caregiver about trying back exercises and gentle massage. This may be of some benefit.  Avoid feeling anxious or stressed.Stress increases muscle tension and can worsen back pain.It is important to recognize when you are anxious or stressed and learn ways to manage it.Exercise is a great option. SEEK MEDICAL CARE IF:  You have pain that is not relieved with rest or medicine.  You have pain that does not improve in 1 week.  You have new symptoms.  You are generally not feeling well. SEEK   IMMEDIATE MEDICAL CARE IF:   You have pain that radiates from your back into your legs.  You develop new bowel or bladder control problems.  You have unusual weakness or numbness in your arms or legs.  You develop nausea or vomiting.  You develop abdominal pain.  You feel faint. Document Released: 01/22/2005 Document Revised: 07/24/2011 Document Reviewed: 05/26/2013 [employer] Patient Information 2015 [employer]. This information is not intended to replace advice given to you by your health care provider. Make sure you  discuss any questions you have with your health care provider.  

## 2013-11-14 NOTE — ED Notes (Signed)
Patient here with lower back and pelvic pain since Thursday. Denies any known injury. Reports that the pain is severe with any movement, reports that he is also had some constipation tht he got relief with laxative.

## 2013-11-16 ENCOUNTER — Ambulatory Visit: Payer: Self-pay | Admitting: Pulmonary Disease

## 2013-11-16 ENCOUNTER — Ambulatory Visit: Payer: Self-pay | Admitting: Dietician

## 2013-11-17 ENCOUNTER — Encounter: Payer: Self-pay | Admitting: Internal Medicine

## 2013-11-17 ENCOUNTER — Ambulatory Visit (INDEPENDENT_AMBULATORY_CARE_PROVIDER_SITE_OTHER): Payer: BC Managed Care – PPO | Admitting: Internal Medicine

## 2013-11-17 VITALS — BP 110/76 | Temp 98.4°F | Ht 68.0 in | Wt 275.0 lb

## 2013-11-17 DIAGNOSIS — M5440 Lumbago with sciatica, unspecified side: Secondary | ICD-10-CM

## 2013-11-17 MED ORDER — HYDROCODONE-ACETAMINOPHEN 5-325 MG PO TABS: 1.0000 | ORAL_TABLET | Freq: Four times a day (QID) | ORAL | Status: DC | PRN

## 2013-11-17 NOTE — Patient Instructions (Addendum)
No work  rest of week  Activity as tolerated .  Avoid  Prolonged sitting heavy lifting .  Most back pain resolved's in weeks and 95 % by a months.  OV next Monday am  To see about work.  Limit the hydrocodone use.  Low Back Strain with Rehab A strain is an injury in which a tendon or muscle is torn. The muscles and tendons of the lower back are vulnerable to strains. However, these muscles and tendons are very strong and require a great force to be injured. Strains are classified into three categories. Grade 1 strains cause pain, but the tendon is not lengthened. Grade 2 strains include a lengthened ligament, due to the ligament being stretched or partially ruptured. With grade 2 strains there is still function, although the function may be decreased. Grade 3 strains involve a complete tear of the tendon or muscle, and function is usually impaired. SYMPTOMS   Pain in the lower back.  Pain that affects one side more than the other.  Pain that gets worse with movement and may be felt in the hip, buttocks, or back of the thigh.  Muscle spasms of the muscles in the back.  Swelling along the muscles of the back.  Loss of strength of the back muscles.  Crackling sound (crepitation) when the muscles are touched. CAUSES  Lower back strains occur when a force is placed on the muscles or tendons that is greater than they can handle. Common causes of injury include:  Prolonged overuse of the muscle-tendon units in the lower back, usually from incorrect posture.  A single violent injury or force applied to the back. RISK INCREASES WITH:  Sports that involve twisting forces on the spine or a lot of bending at the waist (football, rugby, weightlifting, bowling, golf, tennis, speed skating, racquetball, swimming, running, gymnastics, diving).  Poor strength and flexibility.  Failure to warm up properly before activity.  Family history of lower back pain or disk disorders.  Previous back  injury or surgery (especially fusion).  Poor posture with lifting, especially heavy objects.  Prolonged sitting, especially with poor posture. PREVENTION   Learn and use proper posture when sitting or lifting (maintain proper posture when sitting, lift using the knees and legs, not at the waist).  Warm up and stretch properly before activity.  Allow for adequate recovery between workouts.  Maintain physical fitness:  Strength, flexibility, and endurance.  Cardiovascular fitness. PROGNOSIS  If treated properly, lower back strains usually heal within 6 weeks. RELATED COMPLICATIONS   Recurring symptoms, resulting in a chronic problem.  Chronic inflammation, scarring, and partial muscle-tendon tear.  Delayed healing or resolution of symptoms.  Prolonged disability. TREATMENT  Treatment first involves the use of ice and medicine, to reduce pain and inflammation. The use of strengthening and stretching exercises may help reduce pain with activity. These exercises may be performed at home or with a therapist. Severe injuries may require referral to a therapist for further evaluation and treatment, such as ultrasound. Your caregiver may advise that you wear a back brace or corset, to help reduce pain and discomfort. Often, prolonged bed rest results in greater harm then benefit. Corticosteroid injections may be recommended. However, these should be reserved for the most serious cases. It is important to avoid using your back when lifting objects. At night, sleep on your back on a firm mattress with a pillow placed under your knees. If non-surgical treatment is unsuccessful, surgery may be needed.  MEDICATION  If pain medicine is needed, nonsteroidal anti-inflammatory medicines (aspirin and ibuprofen), or other minor pain relievers (acetaminophen), are often advised.  Do not take pain medicine for 7 days before surgery.  Prescription pain relievers may be given, if your caregiver thinks  they are needed. Use only as directed and only as much as you need.  Ointments applied to the skin may be helpful.  Corticosteroid injections may be given by your caregiver. These injections should be reserved for the most serious cases, because they may only be given a certain number of times. HEAT AND COLD  Cold treatment (icing) should be applied for 10 to 15 minutes every 2 to 3 hours for inflammation and pain, and immediately after activity that aggravates your symptoms. Use ice packs or an ice massage.  Heat treatment may be used before performing stretching and strengthening activities prescribed by your caregiver, physical therapist, or athletic trainer. Use a heat pack or a warm water soak. SEEK MEDICAL CARE IF:   Symptoms get worse or do not improve in 2 to 4 weeks, despite treatment.  You develop numbness, weakness, or loss of bowel or bladder function.  New, unexplained symptoms develop. (Drugs used in treatment may produce side effects.) EXERCISES  RANGE OF MOTION (ROM) AND STRETCHING EXERCISES - Low Back Strain Most people with lower back pain will find that their symptoms get worse with excessive bending forward (flexion) or arching at the lower back (extension). The exercises which will help resolve your symptoms will focus on the opposite motion.  Your physician, physical therapist or athletic trainer will help you determine which exercises will be most helpful to resolve your lower back pain. Do not complete any exercises without first consulting with your caregiver. Discontinue any exercises which make your symptoms worse until you speak to your caregiver.  If you have pain, numbness or tingling which travels down into your buttocks, leg or foot, the goal of the therapy is for these symptoms to move closer to your back and eventually resolve. Sometimes, these leg symptoms will get better, but your lower back pain may worsen. This is typically an indication of progress in your  rehabilitation. Be very alert to any changes in your symptoms and the activities in which you participated in the 24 hours prior to the change. Sharing this information with your caregiver will allow him/her to most efficiently treat your condition.  These exercises may help you when beginning to rehabilitate your injury. Your symptoms may resolve with or without further involvement from your physician, physical therapist or athletic trainer. While completing these exercises, remember:  Restoring tissue flexibility helps normal motion to return to the joints. This allows healthier, less painful movement and activity.  An effective stretch should be held for at least 30 seconds.  A stretch should never be painful. You should only feel a gentle lengthening or release in the stretched tissue. FLEXION RANGE OF MOTION AND STRETCHING EXERCISES: STRETCH - Flexion, Single Knee to Chest   Lie on a firm bed or floor with both legs extended in front of you.  Keeping one leg in contact with the floor, bring your opposite knee to your chest. Hold your leg in place by either grabbing behind your thigh or at your knee.  Pull until you feel a gentle stretch in your lower back. Hold __________ seconds.  Slowly release your grasp and repeat the exercise with the opposite side. Repeat __________ times. Complete this exercise __________ times per day.  STRETCH - Flexion,  Double Knee to Chest   Lie on a firm bed or floor with both legs extended in front of you.  Keeping one leg in contact with the floor, bring your opposite knee to your chest.  Tense your stomach muscles to support your back and then lift your other knee to your chest. Hold your legs in place by either grabbing behind your thighs or at your knees.  Pull both knees toward your chest until you feel a gentle stretch in your lower back. Hold __________ seconds.  Tense your stomach muscles and slowly return one leg at a time to the  floor. Repeat __________ times. Complete this exercise __________ times per day.  STRETCH - Low Trunk Rotation  Lie on a firm bed or floor. Keeping your legs in front of you, bend your knees so they are both pointed toward the ceiling and your feet are flat on the floor.  Extend your arms out to the side. This will stabilize your upper body by keeping your shoulders in contact with the floor.  Gently and slowly drop both knees together to one side until you feel a gentle stretch in your lower back. Hold for __________ seconds.  Tense your stomach muscles to support your lower back as you bring your knees back to the starting position. Repeat the exercise to the other side. Repeat __________ times. Complete this exercise __________ times per day  EXTENSION RANGE OF MOTION AND FLEXIBILITY EXERCISES: STRETCH - Extension, Prone on Elbows   Lie on your stomach on the floor, a bed will be too soft. Place your palms about shoulder width apart and at the height of your head.  Place your elbows under your shoulders. If this is too painful, stack pillows under your chest.  Allow your body to relax so that your hips drop lower and make contact more completely with the floor.  Hold this position for __________ seconds.  Slowly return to lying flat on the floor. Repeat __________ times. Complete this exercise __________ times per day.  RANGE OF MOTION - Extension, Prone Press Ups  Lie on your stomach on the floor, a bed will be too soft. Place your palms about shoulder width apart and at the height of your head.  Keeping your back as relaxed as possible, slowly straighten your elbows while keeping your hips on the floor. You may adjust the placement of your hands to maximize your comfort. As you gain motion, your hands will come more underneath your shoulders.  Hold this position __________ seconds.  Slowly return to lying flat on the floor. Repeat __________ times. Complete this exercise  __________ times per day.  RANGE OF MOTION- Quadruped, Neutral Spine   Assume a hands and knees position on a firm surface. Keep your hands under your shoulders and your knees under your hips. You may place padding under your knees for comfort.  Drop your head and point your tail bone toward the ground below you. This will round out your lower back like an angry cat. Hold this position for __________ seconds.  Slowly lift your head and release your tail bone so that your back sags into a large arch, like an old horse.  Hold this position for __________ seconds.  Repeat this until you feel limber in your lower back.  Now, find your "sweet spot." This will be the most comfortable position somewhere between the two previous positions. This is your neutral spine. Once you have found this position, tense your stomach muscles  to support your lower back.  Hold this position for __________ seconds. Repeat __________ times. Complete this exercise __________ times per day.  STRENGTHENING EXERCISES - Low Back Strain These exercises may help you when beginning to rehabilitate your injury. These exercises should be done near your "sweet spot." This is the neutral, low-back arch, somewhere between fully rounded and fully arched, that is your least painful position. When performed in this safe range of motion, these exercises can be used for people who have either a flexion or extension based injury. These exercises may resolve your symptoms with or without further involvement from your physician, physical therapist or athletic trainer. While completing these exercises, remember:   Muscles can gain both the endurance and the strength needed for everyday activities through controlled exercises.  Complete these exercises as instructed by your physician, physical therapist or athletic trainer. Increase the resistance and repetitions only as guided.  You may experience muscle soreness or fatigue, but the pain  or discomfort you are trying to eliminate should never worsen during these exercises. If this pain does worsen, stop and make certain you are following the directions exactly. If the pain is still present after adjustments, discontinue the exercise until you can discuss the trouble with your caregiver. STRENGTHENING - Deep Abdominals, Pelvic Tilt  Lie on a firm bed or floor. Keeping your legs in front of you, bend your knees so they are both pointed toward the ceiling and your feet are flat on the floor.  Tense your lower abdominal muscles to press your lower back into the floor. This motion will rotate your pelvis so that your tail bone is scooping upwards rather than pointing at your feet or into the floor.  With a gentle tension and even breathing, hold this position for __________ seconds. Repeat __________ times. Complete this exercise __________ times per day.  STRENGTHENING - Abdominals, Crunches   Lie on a firm bed or floor. Keeping your legs in front of you, bend your knees so they are both pointed toward the ceiling and your feet are flat on the floor. Cross your arms over your chest.  Slightly tip your chin down without bending your neck.  Tense your abdominals and slowly lift your trunk high enough to just clear your shoulder blades. Lifting higher can put excessive stress on the lower back and does not further strengthen your abdominal muscles.  Control your return to the starting position. Repeat __________ times. Complete this exercise __________ times per day.  STRENGTHENING - Quadruped, Opposite UE/LE Lift   Assume a hands and knees position on a firm surface. Keep your hands under your shoulders and your knees under your hips. You may place padding under your knees for comfort.  Find your neutral spine and gently tense your abdominal muscles so that you can maintain this position. Your shoulders and hips should form a rectangle that is parallel with the floor and is not  twisted.  Keeping your trunk steady, lift your right hand no higher than your shoulder and then your left leg no higher than your hip. Make sure you are not holding your breath. Hold this position __________ seconds.  Continuing to keep your abdominal muscles tense and your back steady, slowly return to your starting position. Repeat with the opposite arm and leg. Repeat __________ times. Complete this exercise __________ times per day.  STRENGTHENING - Lower Abdominals, Double Knee Lift  Lie on a firm bed or floor. Keeping your legs in front of you, bend  your knees so they are both pointed toward the ceiling and your feet are flat on the floor.  Tense your abdominal muscles to brace your lower back and slowly lift both of your knees until they come over your hips. Be certain not to hold your breath.  Hold __________ seconds. Using your abdominal muscles, return to the starting position in a slow and controlled manner. Repeat __________ times. Complete this exercise __________ times per day.  POSTURE AND BODY MECHANICS CONSIDERATIONS - Low Back Strain Keeping correct posture when sitting, standing or completing your activities will reduce the stress put on different body tissues, allowing injured tissues a chance to heal and limiting painful experiences. The following are general guidelines for improved posture. Your physician or physical therapist will provide you with any instructions specific to your needs. While reading these guidelines, remember:  The exercises prescribed by your provider will help you have the flexibility and strength to maintain correct postures.  The correct posture provides the best environment for your joints to work. All of your joints have less wear and tear when properly supported by a spine with good posture. This means you will experience a healthier, less painful body.  Correct posture must be practiced with all of your activities, especially prolonged sitting  and standing. Correct posture is as important when doing repetitive low-stress activities (typing) as it is when doing a single heavy-load activity (lifting). RESTING POSITIONS Consider which positions are most painful for you when choosing a resting position. If you have pain with flexion-based activities (sitting, bending, stooping, squatting), choose a position that allows you to rest in a less flexed posture. You would want to avoid curling into a fetal position on your side. If your pain worsens with extension-based activities (prolonged standing, working overhead), avoid resting in an extended position such as sleeping on your stomach. Most people will find more comfort when they rest with their spine in a more neutral position, neither too rounded nor too arched. Lying on a non-sagging bed on your side with a pillow between your knees, or on your back with a pillow under your knees will often provide some relief. Keep in mind, being in any one position for a prolonged period of time, no matter how correct your posture, can still lead to stiffness. PROPER SITTING POSTURE In order to minimize stress and discomfort on your spine, you must sit with correct posture. Sitting with good posture should be effortless for a healthy body. Returning to good posture is a gradual process. Many people can work toward this most comfortably by using various supports until they have the flexibility and strength to maintain this posture on their own. When sitting with proper posture, your ears will fall over your shoulders and your shoulders will fall over your hips. You should use the back of the chair to support your upper back. Your lower back will be in a neutral position, just slightly arched. You may place a small pillow or folded towel at the base of your lower back for support.  When working at a desk, create an environment that supports good, upright posture. Without extra support, muscles tire, which leads to  excessive strain on joints and other tissues. Keep these recommendations in mind: CHAIR:  A chair should be able to slide under your desk when your back makes contact with the back of the chair. This allows you to work closely.  The chair's height should allow your eyes to be level with the upper  part of your monitor and your hands to be slightly lower than your elbows. BODY POSITION  Your feet should make contact with the floor. If this is not possible, use a foot rest.  Keep your ears over your shoulders. This will reduce stress on your neck and lower back. INCORRECT SITTING POSTURES  If you are feeling tired and unable to assume a healthy sitting posture, do not slouch or slump. This puts excessive strain on your back tissues, causing more damage and pain. Healthier options include:  Using more support, like a lumbar pillow.  Switching tasks to something that requires you to be upright or walking.  Talking a brief walk.  Lying down to rest in a neutral-spine position. PROLONGED STANDING WHILE SLIGHTLY LEANING FORWARD  When completing a task that requires you to lean forward while standing in one place for a long time, place either foot up on a stationary 2-4 inch high object to help maintain the best posture. When both feet are on the ground, the lower back tends to lose its slight inward curve. If this curve flattens (or becomes too large), then the back and your other joints will experience too much stress, tire more quickly, and can cause pain. CORRECT STANDING POSTURES Proper standing posture should be assumed with all daily activities, even if they only take a few moments, like when brushing your teeth. As in sitting, your ears should fall over your shoulders and your shoulders should fall over your hips. You should keep a slight tension in your abdominal muscles to brace your spine. Your tailbone should point down to the ground, not behind your body, resulting in an over-extended  swayback posture.  INCORRECT STANDING POSTURES  Common incorrect standing postures include a forward head, locked knees and/or an excessive swayback. WALKING Walk with an upright posture. Your ears, shoulders and hips should all line-up. PROLONGED ACTIVITY IN A FLEXED POSITION When completing a task that requires you to bend forward at your waist or lean over a low surface, try to find a way to stabilize 3 out of 4 of your limbs. You can place a hand or elbow on your thigh or rest a knee on the surface you are reaching across. This will provide you more stability so that your muscles do not fatigue as quickly. By keeping your knees relaxed, or slightly bent, you will also reduce stress across your lower back. CORRECT LIFTING TECHNIQUES DO :   Assume a wide stance. This will provide you more stability and the opportunity to get as close as possible to the object which you are lifting.  Tense your abdominals to brace your spine. Bend at the knees and hips. Keeping your back locked in a neutral-spine position, lift using your leg muscles. Lift with your legs, keeping your back straight.  Test the weight of unknown objects before attempting to lift them.  Try to keep your elbows locked down at your sides in order get the best strength from your shoulders when carrying an object.  Always ask for help when lifting heavy or awkward objects. INCORRECT LIFTING TECHNIQUES DO NOT:   Lock your knees when lifting, even if it is a small object.  Bend and twist. Pivot at your feet or move your feet when needing to change directions.  Assume that you can safely pick up even a paper clip without proper posture. Document Released: 01/22/2005 Document Revised: 04/16/2011 Document Reviewed: 05/06/2008 [facility] Patient Information 2015 [place], [place]. This information is not intended to  replace advice given to you by your health care provider. Make sure you discuss any questions you have with your health  care provider.

## 2013-11-17 NOTE — Progress Notes (Signed)
Pre visit review using our clinic review tool, if applicable. No additional management support is needed unless otherwise documented below in the visit note.  Chief Complaint  Patient presents with  . Hospitalization Follow-up    ED followup for back pain    HPI: Justin [name] Patient come in for follow up from ED visit for acute back pain  On 10 10/15  Was told to come in this week. Given robaxin hydrocodone and ibuprofen   UA clear  No dx given except back pain . Sitting  throbbing at back of pain and  Standing a lot hurts  Acute onset  Aching late after work. Last week and then Sudden onset sitting at table .  Very severe.  right flank sciatic  went to the ED and now Pain a little lower   Out  Of work until tomorrow . Still has significant pain when sitting aching bending lifting. No radiation down leg but has a foot drop on his right leg anyway because of previous injury. Denies any new weakness fever or UTI symptoms. He does say the pain radiates around his lower abdomen along waistline no rash no injury. He's been taking hydrocodone given in the emergency room as well as ibuprofen and Robaxin. 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 ROS: See pertinent positives and negatives per HPI.hsa right foot drop from previous injury   Family history of back problems and rheumatoid arthritis. He has been losing weight in a healthy manner try to help with blood pressure.   Past Medical History  Diagnosis Date  . Hypertension     on meds since 1998   . Left ventricular hypertrophy     card work up [facility] QRS  . Chicken pox     As a child  . Arthritis     Both knees  . Migraines     Triggered by stress/tension  . Arrhythmia   . High cholesterol     Family History  Problem Relation Age of Onset  . Hypertension Mother   . Other Father     murdered     History   Social History  . Marital Status: Single    Spouse Name: N/A      Number of Children: N/A  . Years of Education: N/A   Occupational History  . 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 Smoking status: Current Every Day Smoker -- 0.25 packs/day for 30 years    Types: Cigarettes  . Smokeless tobacco: Never Used  . Alcohol Use: Yes     Comment: socially  . Drug Use: No  . Sexual Activity: None   Other Topics Concern  . None   Social History Narrative   8 hours of sleep per night   Lives with his girlfriend and step-daughter.   College/Maintenance Tech/Works full time   [employer]    physically active  Job  Sometimes 30- 86    Tobacco age 10  stopped or a month .   etoh  less than 76 per week.    Sodas  And sugar sodas  7-8 per day and then came down  To 1 day.    orig from [place] takes vitamins                   Outpatient Encounter Prescriptions as of 11/17/2013  Medication Sig  . hydrochlorothiazide (HYDRODIURIL) 25 MG tablet Take 1 tablet (25 mg total) by mouth daily.  Marland Kitchen HYDROcodone-acetaminophen (NORCO/VICODIN) 5-325 MG per tablet Take 1-2 tablets by mouth every 6 (six) hours as needed.  Marland Kitchen ibuprofen (ADVIL,MOTRIN) 800 MG tablet Take 1 tablet (800 mg total) by mouth 3 (three) times daily.  Marland Kitchen lisinopril (PRINIVIL,ZESTRIL) 10 MG tablet Take 1 tablet (10 mg total) by mouth daily.  . methocarbamol (ROBAXIN) 500 MG tablet Take 1 tablet (500 mg total) by mouth 2 (two) times daily.  . [DISCONTINUED] HYDROcodone-acetaminophen (NORCO/VICODIN) 5-325 MG per tablet Take 2 tablets by mouth every 4 (four) hours as needed.  . [DISCONTINUED] HYDROcodone-acetaminophen (NORCO/VICODIN) 5-325 MG per tablet Take 2 tablets by mouth every 4 (four) hours as needed.  . [DISCONTINUED] doxycycline (VIBRAMYCIN) 100 MG capsule Take 1 capsule (100 mg total) by mouth 2 (two) times daily. One po bid x 7 days    EXAM:  BP 110/76  Temp(Src) 98.4 F (36.9 C) (Oral)  Ht 5\' 8"  (1.727 m)  Wt 275 lb (124.739 kg)   BMI 41.82 kg/m2  Body mass index is 41.82 kg/(m^2).  GENERAL: vitals reviewed and listed above, alert, oriented, appears well hydrated and in no acute distress he is uncomfortable when sitting. More comfortable standing against the wall or been in over the table.  HEENT: atraumatic, conjunctiva  clear, no obvious abnormalities on inspection of external nose and ears gait is slow but normal for him except for mild foot drop on the right  this is old NECK: no obvious masses on inspection palpation   CV: HRRR, no clubbing cyanosis or  peripheral edema nl cap refill  MS: moves all extremities  there is tenderness in the lower LS-spine and over the SI area more on the left than the right abdomen soft without organomegaly guarding or rebound. DTRs hard to elicit but he does have patellar reflexes. No obvious clonus.  PSYCH: pleasant and cooperative, no obvious depression or anxiety Wt Readings from Last 3 Encounters:  11/17/13 275 lb (124.739 kg)  11/14/13 276 lb (125.193 kg)  09/28/13 276 lb 3.2 oz (125.283 kg)    ASSESSMENT AND PLAN:  Discussed the following assessment and plan:  Low back pain with sciatica, sciatica laterality unspecified, unspecified back pain laterality - severity worse with sitting   Morbid obesity - working on weight loss Right foot drop from prev injury .  Review of CT scan no aortic aneurysm or other internal abnormality. Suspect musculoskeletal cause acute back pain no other alarm symptoms except for severity and he hasn't had this before. Caution with hydrocodone take a matter work this week as I don't think he is ready to go back to do physical labor recheck him on Monday after the weekend in 5 days. May need modified duty. Discussed back hygiene activity expectant management small amount hydrocodone given continue the ibuprofen. -Patient advised to return or notify health care team  if symptoms worsen ,persist or new concerns arise.  Patient Instructions  No work   rest of week  Activity as tolerated .  Avoid  Prolonged sitting heavy lifting .  Most back pain resolved's in weeks and 95 % by a months.  OV next Monday am  To see about work.  Limit the hydrocodone use.  Low Back Strain with Rehab A strain is an injury in which a tendon or muscle is torn. The muscles and tendons of the lower back are vulnerable to strains. However, these muscles and tendons  are very strong and require a great force to be injured. Strains are classified into three categories. Grade 1 strains cause pain, but the tendon is not lengthened. Grade 2 strains include a lengthened ligament, due to the ligament being stretched or partially ruptured. With grade 2 strains there is still function, although the function may be decreased. Grade 3 strains involve a complete tear of the tendon or muscle, and function is usually impaired. SYMPTOMS   Pain in the lower back.  Pain that affects one side more than the other.  Pain that gets worse with movement and may be felt in the hip, buttocks, or back of the thigh.  Muscle spasms of the muscles in the back.  Swelling along the muscles of the back.  Loss of strength of the back muscles.  Crackling sound (crepitation) when the muscles are touched. CAUSES  Lower back strains occur when a force is placed on the muscles or tendons that is greater than they can handle. Common causes of injury include:  Prolonged overuse of the muscle-tendon units in the lower back, usually from incorrect posture.  A single violent injury or force applied to the back. RISK INCREASES WITH:  Sports that involve twisting forces on the spine or a lot of bending at the waist (football, rugby, weightlifting, bowling, golf, tennis, speed skating, racquetball, swimming, running, gymnastics, diving).  Poor strength and flexibility.  Failure to warm up properly before activity.  Family history of lower back pain or disk disorders.  Previous back injury or  surgery (especially fusion).  Poor posture with lifting, especially heavy objects.  Prolonged sitting, especially with poor posture. PREVENTION   Learn and use proper posture when sitting or lifting (maintain proper posture when sitting, lift using the knees and legs, not at the waist).  Warm up and stretch properly before activity.  Allow for adequate recovery between workouts.  Maintain physical fitness:  Strength, flexibility, and endurance.  Cardiovascular fitness. PROGNOSIS  If treated properly, lower back strains usually heal within 6 weeks. RELATED COMPLICATIONS   Recurring symptoms, resulting in a chronic problem.  Chronic inflammation, scarring, and partial muscle-tendon tear.  Delayed healing or resolution of symptoms.  Prolonged disability. TREATMENT  Treatment first involves the use of ice and medicine, to reduce pain and inflammation. The use of strengthening and stretching exercises may help reduce pain with activity. These exercises may be performed at home or with a therapist. Severe injuries may require referral to a therapist for further evaluation and treatment, such as ultrasound. Your caregiver may advise that you wear a back brace or corset, to help reduce pain and discomfort. Often, prolonged bed rest results in greater harm then benefit. Corticosteroid injections may be recommended. However, these should be reserved for the most serious cases. It is important to avoid using your back when lifting objects. At night, sleep on your back on a firm mattress with a pillow placed under your knees. If non-surgical treatment is unsuccessful, surgery may be needed.  MEDICATION   If pain medicine is needed, nonsteroidal anti-inflammatory medicines (aspirin and ibuprofen), or other minor pain relievers (acetaminophen), are often advised.  Do not take pain medicine for 7 days before surgery.  Prescription pain relievers may be given, if your caregiver thinks they are  needed. Use only as directed and only as much as you need.  Ointments applied to the skin may be helpful.  Corticosteroid injections may be given by your caregiver. These injections should be reserved for the most  serious cases, because they may only be given a certain number of times. HEAT AND COLD  Cold treatment (icing) should be applied for 10 to 15 minutes every 2 to 3 hours for inflammation and pain, and immediately after activity that aggravates your symptoms. Use ice packs or an ice massage.  Heat treatment may be used before performing stretching and strengthening activities prescribed by your caregiver, physical therapist, or athletic trainer. Use a heat pack or a warm water soak. SEEK MEDICAL CARE IF:   Symptoms get worse or do not improve in 2 to 4 weeks, despite treatment.  You develop numbness, weakness, or loss of bowel or bladder function.  New, unexplained symptoms develop. (Drugs used in treatment may produce side effects.) EXERCISES  RANGE OF MOTION (ROM) AND STRETCHING EXERCISES - Low Back Strain Most people with lower back pain will find that their symptoms get worse with excessive bending forward (flexion) or arching at the lower back (extension). The exercises which will help resolve your symptoms will focus on the opposite motion.  Your physician, physical therapist or athletic trainer will help you determine which exercises will be most helpful to resolve your lower back pain. Do not complete any exercises without first consulting with your caregiver. Discontinue any exercises which make your symptoms worse until you speak to your caregiver.  If you have pain, numbness or tingling which travels down into your buttocks, leg or foot, the goal of the therapy is for these symptoms to move closer to your back and eventually resolve. Sometimes, these leg symptoms will get better, but your lower back pain may worsen. This is typically an indication of progress in your  rehabilitation. Be very alert to any changes in your symptoms and the activities in which you participated in the 24 hours prior to the change. Sharing this information with your caregiver will allow him/her to most efficiently treat your condition.  These exercises may help you when beginning to rehabilitate your injury. Your symptoms may resolve with or without further involvement from your physician, physical therapist or athletic trainer. While completing these exercises, remember:  Restoring tissue flexibility helps normal motion to return to the joints. This allows healthier, less painful movement and activity.  An effective stretch should be held for at least 30 seconds.  A stretch should never be painful. You should only feel a gentle lengthening or release in the stretched tissue. FLEXION RANGE OF MOTION AND STRETCHING EXERCISES: STRETCH - Flexion, Single Knee to Chest   Lie on a firm bed or floor with both legs extended in front of you.  Keeping one leg in contact with the floor, bring your opposite knee to your chest. Hold your leg in place by either grabbing behind your thigh or at your knee.  Pull until you feel a gentle stretch in your lower back. Hold __________ seconds.  Slowly release your grasp and repeat the exercise with the opposite side. Repeat __________ times. Complete this exercise __________ times per day.  STRETCH - Flexion, Double Knee to Chest   Lie on a firm bed or floor with both legs extended in front of you.  Keeping one leg in contact with the floor, bring your opposite knee to your chest.  Tense your stomach muscles to support your back and then lift your other knee to your chest. Hold your legs in place by either grabbing behind your thighs or at your knees.  Pull both knees toward your chest until you feel a gentle stretch  in your lower back. Hold __________ seconds.  Tense your stomach muscles and slowly return one leg at a time to the  floor. Repeat __________ times. Complete this exercise __________ times per day.  STRETCH - Low Trunk Rotation  Lie on a firm bed or floor. Keeping your legs in front of you, bend your knees so they are both pointed toward the ceiling and your feet are flat on the floor.  Extend your arms out to the side. This will stabilize your upper body by keeping your shoulders in contact with the floor.  Gently and slowly drop both knees together to one side until you feel a gentle stretch in your lower back. Hold for __________ seconds.  Tense your stomach muscles to support your lower back as you bring your knees back to the starting position. Repeat the exercise to the other side. Repeat __________ times. Complete this exercise __________ times per day  EXTENSION RANGE OF MOTION AND FLEXIBILITY EXERCISES: STRETCH - Extension, Prone on Elbows   Lie on your stomach on the floor, a bed will be too soft. Place your palms about shoulder width apart and at the height of your head.  Place your elbows under your shoulders. If this is too painful, stack pillows under your chest.  Allow your body to relax so that your hips drop lower and make contact more completely with the floor.  Hold this position for __________ seconds.  Slowly return to lying flat on the floor. Repeat __________ times. Complete this exercise __________ times per day.  RANGE OF MOTION - Extension, Prone Press Ups  Lie on your stomach on the floor, a bed will be too soft. Place your palms about shoulder width apart and at the height of your head.  Keeping your back as relaxed as possible, slowly straighten your elbows while keeping your hips on the floor. You may adjust the placement of your hands to maximize your comfort. As you gain motion, your hands will come more underneath your shoulders.  Hold this position __________ seconds.  Slowly return to lying flat on the floor. Repeat __________ times. Complete this exercise  __________ times per day.  RANGE OF MOTION- Quadruped, Neutral Spine   Assume a hands and knees position on a firm surface. Keep your hands under your shoulders and your knees under your hips. You may place padding under your knees for comfort.  Drop your head and point your tail bone toward the ground below you. This will round out your lower back like an angry cat. Hold this position for __________ seconds.  Slowly lift your head and release your tail bone so that your back sags into a large arch, like an old horse.  Hold this position for __________ seconds.  Repeat this until you feel limber in your lower back.  Now, find your "sweet spot." This will be the most comfortable position somewhere between the two previous positions. This is your neutral spine. Once you have found this position, tense your stomach muscles to support your lower back.  Hold this position for __________ seconds. Repeat __________ times. Complete this exercise __________ times per day.  STRENGTHENING EXERCISES - Low Back Strain These exercises may help you when beginning to rehabilitate your injury. These exercises should be done near your "sweet spot." This is the neutral, low-back arch, somewhere between fully rounded and fully arched, that is your least painful position. When performed in this safe range of motion, these exercises can be used for people  who have either a flexion or extension based injury. These exercises may resolve your symptoms with or without further involvement from your physician, physical therapist or athletic trainer. While completing these exercises, remember:   Muscles can gain both the endurance and the strength needed for everyday activities through controlled exercises.  Complete these exercises as instructed by your physician, physical therapist or athletic trainer. Increase the resistance and repetitions only as guided.  You may experience muscle soreness or fatigue, but the pain  or discomfort you are trying to eliminate should never worsen during these exercises. If this pain does worsen, stop and make certain you are following the directions exactly. If the pain is still present after adjustments, discontinue the exercise until you can discuss the trouble with your caregiver. STRENGTHENING - Deep Abdominals, Pelvic Tilt  Lie on a firm bed or floor. Keeping your legs in front of you, bend your knees so they are both pointed toward the ceiling and your feet are flat on the floor.  Tense your lower abdominal muscles to press your lower back into the floor. This motion will rotate your pelvis so that your tail bone is scooping upwards rather than pointing at your feet or into the floor.  With a gentle tension and even breathing, hold this position for __________ seconds. Repeat __________ times. Complete this exercise __________ times per day.  STRENGTHENING - Abdominals, Crunches   Lie on a firm bed or floor. Keeping your legs in front of you, bend your knees so they are both pointed toward the ceiling and your feet are flat on the floor. Cross your arms over your chest.  Slightly tip your chin down without bending your neck.  Tense your abdominals and slowly lift your trunk high enough to just clear your shoulder blades. Lifting higher can put excessive stress on the lower back and does not further strengthen your abdominal muscles.  Control your return to the starting position. Repeat __________ times. Complete this exercise __________ times per day.  STRENGTHENING - Quadruped, Opposite UE/LE Lift   Assume a hands and knees position on a firm surface. Keep your hands under your shoulders and your knees under your hips. You may place padding under your knees for comfort.  Find your neutral spine and gently tense your abdominal muscles so that you can maintain this position. Your shoulders and hips should form a rectangle that is parallel with the floor and is not  twisted.  Keeping your trunk steady, lift your right hand no higher than your shoulder and then your left leg no higher than your hip. Make sure you are not holding your breath. Hold this position __________ seconds.  Continuing to keep your abdominal muscles tense and your back steady, slowly return to your starting position. Repeat with the opposite arm and leg. Repeat __________ times. Complete this exercise __________ times per day.  STRENGTHENING - Lower Abdominals, Double Knee Lift  Lie on a firm bed or floor. Keeping your legs in front of you, bend your knees so they are both pointed toward the ceiling and your feet are flat on the floor.  Tense your abdominal muscles to brace your lower back and slowly lift both of your knees until they come over your hips. Be certain not to hold your breath.  Hold __________ seconds. Using your abdominal muscles, return to the starting position in a slow and controlled manner. Repeat __________ times. Complete this exercise __________ times per day.  POSTURE AND BODY MECHANICS CONSIDERATIONS -  Low Back Strain Keeping correct posture when sitting, standing or completing your activities will reduce the stress put on different body tissues, allowing injured tissues a chance to heal and limiting painful experiences. The following are general guidelines for improved posture. Your physician or physical therapist will provide you with any instructions specific to your needs. While reading these guidelines, remember:  The exercises prescribed by your provider will help you have the flexibility and strength to maintain correct postures.  The correct posture provides the best environment for your joints to work. All of your joints have less wear and tear when properly supported by a spine with good posture. This means you will experience a healthier, less painful body.  Correct posture must be practiced with all of your activities, especially prolonged sitting  and standing. Correct posture is as important when doing repetitive low-stress activities (typing) as it is when doing a single heavy-load activity (lifting). RESTING POSITIONS Consider which positions are most painful for you when choosing a resting position. If you have pain with flexion-based activities (sitting, bending, stooping, squatting), choose a position that allows you to rest in a less flexed posture. You would want to avoid curling into a fetal position on your side. If your pain worsens with extension-based activities (prolonged standing, working overhead), avoid resting in an extended position such as sleeping on your stomach. Most people will find more comfort when they rest with their spine in a more neutral position, neither too rounded nor too arched. Lying on a non-sagging bed on your side with a pillow between your knees, or on your back with a pillow under your knees will often provide some relief. Keep in mind, being in any one position for a prolonged period of time, no matter how correct your posture, can still lead to stiffness. PROPER SITTING POSTURE In order to minimize stress and discomfort on your spine, you must sit with correct posture. Sitting with good posture should be effortless for a healthy body. Returning to good posture is a gradual process. Many people can work toward this most comfortably by using various supports until they have the flexibility and strength to maintain this posture on their own. When sitting with proper posture, your ears will fall over your shoulders and your shoulders will fall over your hips. You should use the back of the chair to support your upper back. Your lower back will be in a neutral position, just slightly arched. You may place a small pillow or folded towel at the base of your lower back for support.  When working at a desk, create an environment that supports good, upright posture. Without extra support, muscles tire, which leads to  excessive strain on joints and other tissues. Keep these recommendations in mind: CHAIR:  A chair should be able to slide under your desk when your back makes contact with the back of the chair. This allows you to work closely.  The chair's height should allow your eyes to be level with the upper part of your monitor and your hands to be slightly lower than your elbows. BODY POSITION  Your feet should make contact with the floor. If this is not possible, use a foot rest.  Keep your ears over your shoulders. This will reduce stress on your neck and lower back. INCORRECT SITTING POSTURES  If you are feeling tired and unable to assume a healthy sitting posture, do not slouch or slump. This puts excessive strain on your back tissues, causing more damage  and pain. Healthier options include:  Using more support, like a lumbar pillow.  Switching tasks to something that requires you to be upright or walking.  Talking a brief walk.  Lying down to rest in a neutral-spine position. PROLONGED STANDING WHILE SLIGHTLY LEANING FORWARD  When completing a task that requires you to lean forward while standing in one place for a long time, place either foot up on a stationary 2-4 inch high object to help maintain the best posture. When both feet are on the ground, the lower back tends to lose its slight inward curve. If this curve flattens (or becomes too large), then the back and your other joints will experience too much stress, tire more quickly, and can cause pain. CORRECT STANDING POSTURES Proper standing posture should be assumed with all daily activities, even if they only take a few moments, like when brushing your teeth. As in sitting, your ears should fall over your shoulders and your shoulders should fall over your hips. You should keep a slight tension in your abdominal muscles to brace your spine. Your tailbone should point down to the ground, not behind your body, resulting in an over-extended  swayback posture.  INCORRECT STANDING POSTURES  Common incorrect standing postures include a forward head, locked knees and/or an excessive swayback. WALKING Walk with an upright posture. Your ears, shoulders and hips should all line-up. PROLONGED ACTIVITY IN A FLEXED POSITION When completing a task that requires you to bend forward at your waist or lean over a low surface, try to find a way to stabilize 3 out of 4 of your limbs. You can place a hand or elbow on your thigh or rest a knee on the surface you are reaching across. This will provide you more stability so that your muscles do not fatigue as quickly. By keeping your knees relaxed, or slightly bent, you will also reduce stress across your lower back. CORRECT LIFTING TECHNIQUES DO :   Assume a wide stance. This will provide you more stability and the opportunity to get as close as possible to the object which you are lifting.  Tense your abdominals to brace your spine. Bend at the knees and hips. Keeping your back locked in a neutral-spine position, lift using your leg muscles. Lift with your legs, keeping your back straight.  Test the weight of unknown objects before attempting to lift them.  Try to keep your elbows locked down at your sides in order get the best strength from your shoulders when carrying an object.  Always ask for help when lifting heavy or awkward objects. INCORRECT LIFTING TECHNIQUES DO NOT:   Lock your knees when lifting, even if it is a small object.  Bend and twist. Pivot at your feet or move your feet when needing to change directions.  Assume that you can safely pick up even a paper clip without proper posture. Document Released: 01/22/2005 Document Revised: 04/16/2011 Document Reviewed: 05/06/2008 [facility] Patient Information 2015 [place], [place]. This information is not intended to replace advice given to you by your health care provider. Make sure you discuss any questions you have with your health  care provider.      Standley Brooking. Morton Simson M.D. Total visit 71mins > 50% spent counseling and coordinating care

## 2013-11-18 ENCOUNTER — Telehealth: Payer: Self-pay | Admitting: Internal Medicine

## 2013-11-18 NOTE — Telephone Encounter (Signed)
[facility] EMAILED  °

## 2013-11-23 ENCOUNTER — Encounter: Payer: Self-pay | Admitting: Internal Medicine

## 2013-11-23 ENCOUNTER — Ambulatory Visit (INDEPENDENT_AMBULATORY_CARE_PROVIDER_SITE_OTHER): Payer: BC Managed Care – PPO | Admitting: Internal Medicine

## 2013-11-23 VITALS — BP 120/86 | Temp 98.6°F | Wt 273.6 lb

## 2013-11-23 DIAGNOSIS — Z2821 Immunization not carried out because of patient refusal: Secondary | ICD-10-CM

## 2013-11-23 DIAGNOSIS — M545 Low back pain, unspecified: Secondary | ICD-10-CM | POA: Insufficient documentation

## 2013-11-23 DIAGNOSIS — M5440 Lumbago with sciatica, unspecified side: Secondary | ICD-10-CM

## 2013-11-23 NOTE — Progress Notes (Signed)
Pre visit review using our clinic review tool, if applicable. No additional management support is needed unless otherwise documented below in the visit note.  Chief Complaint  Patient presents with  . Follow-up    back paoin     HPI: Justin [name] is 43 y.o. come in for fu of severe back pain  Flank pain  See last vist   Kept him oput of work and now is  Better but feels lower   Pressure with bowel ,movement right to abd almost groin area  And worse with sitting  And has to strain  And  Otherwise a whole lot better    Was trying [employer]. [employer]nc  Muscle strength .    ? Cause . Of any sx  No fever falling rash  ROS: See pertinent positives and negatives per HPI.  Past Medical History  Diagnosis Date  . Hypertension     on meds since 1998   . Left ventricular hypertrophy     card work up [facility] QRS  . Chicken pox     As a child  . Arthritis     Both knees  . Migraines     Triggered by stress/tension  . Arrhythmia   . High cholesterol     Family History  Problem Relation Age of Onset  . Hypertension Mother   . Other Father     murdered     History   Social History  . Marital Status: Single    Spouse Name: N/A    Number of Children: N/A  . Years of Education: N/A   Occupational History  . 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 Smoking status: Current Every Day Smoker -- 0.25 packs/day for 30 years    Types: Cigarettes  . Smokeless tobacco: Never Used  . Alcohol Use: Yes     Comment: socially  . Drug Use: No  . Sexual Activity: None   Other Topics Concern  . None   Social History Narrative   8 hours of sleep per night   Lives with his girlfriend and step-daughter.   College/Maintenance Tech/Works full time   [employer]    physically active  Job  Sometimes 39- 63    Tobacco age 44  stopped or a month .   etoh  less than 76 per week.    Sodas  And sugar sodas  7-8 per day and then came down  To 1 day.    orig  from [place] [facility] takes vitamins                   Outpatient Encounter Prescriptions as of 11/23/2013  Medication Sig  . hydrochlorothiazide (HYDRODIURIL) 25 MG tablet Take 1 tablet (25 mg total) by mouth daily.  Marland Kitchen HYDROcodone-acetaminophen (NORCO/VICODIN) 5-325 MG per tablet Take 1-2 tablets by mouth every 6 (six) hours as needed.  Marland Kitchen ibuprofen (ADVIL,MOTRIN) 800 MG tablet Take 1 tablet (800 mg total) by mouth 3 (three) times daily.  Marland Kitchen lisinopril (PRINIVIL,ZESTRIL) 10 MG tablet Take 1 tablet (10 mg total) by mouth daily.  . methocarbamol (ROBAXIN) 500 MG tablet Take 1 tablet (500 mg total) by mouth 2 (two) times daily.    EXAM:  BP 120/86  Temp(Src) 98.6 F (37 C) (Oral)  Wt 273 lb 9.6 oz (124.104 kg)  Body mass index is 41.61 kg/(m^2).  GENERAL: vitals reviewed and listed above, alert, oriented,  appears well hydrated and in no acute distressmore comfortable  HEENT: atraumatic, conjunctiva  clear, no obvious abnormalities on inspection of external nose and earsor  CV: HRRR, no clubbing cyanosis or  MS: moves all extremities without noticeable focal  Abnormality Abdomen:  Sof,t normal bowel sounds without hepatosplenomegaly, no guarding rebound or masses no CVA tenderness to low right pelvic area and right si area of  Discomfort  Gait ok more uncomfortable when sitting  PSYCH: pleasant and cooperative, no obvious depression or anxiety Pain worse with sitting  ASSESSMENT AND PLAN:  Discussed the following assessment and plan:  Right-sided low back pain without sciatica - improved but has lower pain with straining right pelvic area radiating? stool softening ok to work but plan referral atypical features noted .  - Plan: Ambulatory referral to Orthopedic Surgery  Low back pain with sciatica, sciatica laterality unspecified, unspecified back pain laterality - Plan: Ambulatory referral to Orthopedic Surgery  Influenza vaccination declined Note for work back hygiene  reviewed  -Patient advised to return or notify health care team  if symptoms worsen ,persist or new concerns arise.  Patient Instructions  Will plan on getting  Back evaluation.  Uncertain cause of the radiating pain .     Will arrange a referral  Opinion.   Pain meds can cause constipation  Increase fluids  Fruits and vegges . Avoid  Prolonged sitting  This is bad on your back.           Standley Brooking. Kuba Shepherd M.D.

## 2013-11-23 NOTE — Patient Instructions (Signed)
Will plan on getting  Back evaluation.  Uncertain cause of the radiating pain .     Will arrange a referral  Opinion.   Pain meds can cause constipation  Increase fluids  Fruits and vegges . Avoid  Prolonged sitting  This is bad on your back.

## 2013-11-27 ENCOUNTER — Ambulatory Visit: Payer: Self-pay | Admitting: Pulmonary Disease

## 2013-11-30 ENCOUNTER — Ambulatory Visit (INDEPENDENT_AMBULATORY_CARE_PROVIDER_SITE_OTHER): Payer: BC Managed Care – PPO | Admitting: Internal Medicine

## 2013-11-30 ENCOUNTER — Encounter: Payer: Self-pay | Admitting: Internal Medicine

## 2013-11-30 VITALS — BP 112/80 | Temp 98.1°F | Wt 274.0 lb

## 2013-11-30 DIAGNOSIS — M5440 Lumbago with sciatica, unspecified side: Secondary | ICD-10-CM

## 2013-11-30 DIAGNOSIS — R1030 Lower abdominal pain, unspecified: Secondary | ICD-10-CM

## 2013-11-30 DIAGNOSIS — M545 Low back pain, unspecified: Secondary | ICD-10-CM | POA: Insufficient documentation

## 2013-11-30 DIAGNOSIS — R399 Unspecified symptoms and signs involving the genitourinary system: Secondary | ICD-10-CM

## 2013-11-30 DIAGNOSIS — R1031 Right lower quadrant pain: Secondary | ICD-10-CM

## 2013-11-30 DIAGNOSIS — R1032 Left lower quadrant pain: Principal | ICD-10-CM

## 2013-11-30 DIAGNOSIS — R350 Frequency of micturition: Secondary | ICD-10-CM | POA: Insufficient documentation

## 2013-11-30 DIAGNOSIS — R109 Unspecified abdominal pain: Secondary | ICD-10-CM

## 2013-11-30 LAB — POCT URINALYSIS DIPSTICK
Bilirubin, UA: NEGATIVE
Blood, UA: NEGATIVE
GLUCOSE UA: NEGATIVE
KETONES UA: NEGATIVE
Leukocytes, UA: NEGATIVE
NITRITE UA: NEGATIVE
PH UA: 6
Protein, UA: NEGATIVE
SPEC GRAV UA: 1.02
Urobilinogen, UA: 0.2

## 2013-11-30 NOTE — Progress Notes (Signed)
Pre visit review using our clinic review tool, if applicable. No additional management support is needed unless otherwise documented below in the visit note.  Chief Complaint  Patient presents with  . Abdominal Pain  . Urinary Frequency    HPI: Patient Justin [name]  comes in today for SDA for  problem evaluation. Since his last visit he is having ongoing problems and now has pain in his groin bilaterally and radiating into his testicles. He also has very low back pain central. He stopped taking the pain pill because he thought it was causing constipation he strains but then can't go same thing with his urine although his stream is normal no fever.  See past visits related to back flank area  has appointment with a back doctor orthopedic Dr. Layne Benton this week had to be rescheduled  ROS: See pertinent positives and negatives per HPI. No chest pain shortness of breath fever blood in his urine dysuria although has some discomfort with voiding  Past Medical History  Diagnosis Date  . Hypertension     on meds since 1998   . Left ventricular hypertrophy     card work up [facility] QRS  . Chicken pox     As a child  . Arthritis     Both knees  . Migraines     Triggered by stress/tension  . Arrhythmia   . High cholesterol     Family History  Problem Relation Age of Onset  . Hypertension Mother   . Other Father     murdered     History   Social History  . Marital Status: Single    Spouse Name: N/A    Number of Children: N/A  . 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 Smoking status: Current Every Day Smoker -- 0.25 packs/day for 30 years    Types: Cigarettes  . Smokeless tobacco: Never Used  . Alcohol Use: Yes     Comment: socially  . Drug Use: No  . Sexual Activity: None   Other Topics Concern  . None   Social History Narrative   8 hours of sleep per night   Lives with his  girlfriend and step-daughter.   College/Maintenance Tech/Works full time   [employer]    physically active  Job  Sometimes 43- 22    Tobacco age 89  stopped or a month .   etoh  less than 76 per week.    Sodas  And sugar sodas  7-8 per day and then came down  To 1 day.    orig from [place] [facility] takes vitamins                   Outpatient Encounter Prescriptions as of 11/30/2013  Medication Sig  . hydrochlorothiazide (HYDRODIURIL) 25 MG tablet Take 1 tablet (25 mg total) by mouth daily.  Marland Kitchen ibuprofen (ADVIL,MOTRIN) 800 MG tablet Take 1 tablet (800 mg total) by mouth 3 (three) times daily.  Marland Kitchen lisinopril (PRINIVIL,ZESTRIL) 10 MG tablet Take 1 tablet (10 mg total) by mouth daily.  . methocarbamol (ROBAXIN) 500 MG tablet Take 1 tablet (500 mg total) by mouth 2 (two) times daily.  . [DISCONTINUED] HYDROcodone-acetaminophen (NORCO/VICODIN) 5-325 MG per tablet Take 1-2 tablets by mouth every 6 (six) hours as needed.    EXAM:  BP 112/80  Temp(Src) 98.1 F (  36.7 C) (Oral)  Wt 274 lb (124.286 kg)  Body mass index is 41.67 kg/(m^2).  GENERAL: vitals reviewed and listed above, alert, oriented, appears well hydrated and in no acute distress HEENT: atraumatic, conjunctiva  clear, no obvious abnormalities on inspection of external nose and ears NECK: no obvious masses on inspection palpation   standing I don't see an obvious hernia points to the suprapubic area near the pubic symphysis testicles not swollen are no masses obvious no edema. MS: moves all extremities without noticeable focal  abnormality Urinalysis reviewed. CT scan review no prostate enlargement ;  no pelvic masses  ASSESSMENT AND PLAN:  Discussed the following assessment and plan:  Bilateral groin pain - Plan: Ambulatory referral to Urology  Urinary frequency - Plan: POC Urinalysis Dipstick  Low back pain with sciatica, sciatica laterality unspecified, unspecified back pain laterality  Lower  urinary tract symptoms (LUTS) - ? bowel urinary agre pt stop the pain pill? related to sacaral back pain or GU issue keep ortho appt refer to URO - Plan: Ambulatory referral to Urology  Abdominal pain, unspecified abdominal location - Plan: POC Urinalysis Dipstick, Ambulatory referral to Urology Sounds like perineal pain I cannot tell if it's related to low back pathology or an actual GU problem. He was right to stop the pain pill for constipation told to add MiraLAX To keep his appointment on Wednesday of this week and we'll do referral to urology also. Urinalysis today was clear -Patient advised to return or notify health care team  if symptoms worsen ,persist or new concerns arise.  Patient Instructions  Keep appt . With the   Ortho the area of pain seems  the sacral area.  concern if  Back pain is radiating  to the groin area .  We are going to get  Urology to see you because of the  Groin GU pain and  radiation.  I  cannot tell if this pain  from back   or  From bowel.  Bladder  dysfunction.   Begin miralax  for poss constipation.  In the interim.  Urine looks clear today.   fortunately there were no masses on the  Ct scan done in the ED   Mariann Laster K. Analayah Brooke M.D.

## 2013-11-30 NOTE — Patient Instructions (Addendum)
Keep appt . With the   Ortho the area of pain seems  the sacral area.  concern if  Back pain is radiating  to the groin area .  We are going to get  Urology to see you because of the  Groin GU pain and  radiation.  I  cannot tell if this pain  from back   or  From bowel.  Bladder  dysfunction.   Begin miralax  for poss constipation.  In the interim.  Urine looks clear today.   fortunately there were no masses on the  Ct scan done in the ED

## 2013-12-11 ENCOUNTER — Other Ambulatory Visit: Payer: Self-pay | Admitting: Internal Medicine

## 2013-12-11 NOTE — Telephone Encounter (Signed)
Sent to the pharmacy by e-scribe. 

## 2014-02-17 ENCOUNTER — Encounter (HOSPITAL_BASED_OUTPATIENT_CLINIC_OR_DEPARTMENT_OTHER): Payer: Self-pay

## 2014-02-17 ENCOUNTER — Emergency Department (HOSPITAL_BASED_OUTPATIENT_CLINIC_OR_DEPARTMENT_OTHER)
Admission: EM | Admit: 2014-02-17 | Discharge: 2014-02-17 | Disposition: A | Discharge status: Home / Self Care | Payer: BLUE CROSS/BLUE SHIELD | Attending: Emergency Medicine | Admitting: Emergency Medicine

## 2014-02-17 ENCOUNTER — Emergency Department (HOSPITAL_BASED_OUTPATIENT_CLINIC_OR_DEPARTMENT_OTHER): Payer: BLUE CROSS/BLUE SHIELD

## 2014-02-17 DIAGNOSIS — Z880 Allergy status to penicillin: Secondary | ICD-10-CM | POA: Insufficient documentation

## 2014-02-17 DIAGNOSIS — G43909 Migraine, unspecified, not intractable, without status migrainosus: Secondary | ICD-10-CM | POA: Insufficient documentation

## 2014-02-17 DIAGNOSIS — Z720 Tobacco use: Secondary | ICD-10-CM | POA: Diagnosis not present

## 2014-02-17 DIAGNOSIS — F419 Anxiety disorder, unspecified: Secondary | ICD-10-CM | POA: Diagnosis not present

## 2014-02-17 DIAGNOSIS — M13862 Other specified arthritis, left knee: Secondary | ICD-10-CM | POA: Diagnosis not present

## 2014-02-17 DIAGNOSIS — Z79899 Other long term (current) drug therapy: Secondary | ICD-10-CM | POA: Insufficient documentation

## 2014-02-17 DIAGNOSIS — M13861 Other specified arthritis, right knee: Secondary | ICD-10-CM | POA: Diagnosis not present

## 2014-02-17 DIAGNOSIS — I10 Essential (primary) hypertension: Secondary | ICD-10-CM | POA: Insufficient documentation

## 2014-02-17 DIAGNOSIS — Z8639 Personal history of other endocrine, nutritional and metabolic disease: Secondary | ICD-10-CM | POA: Diagnosis not present

## 2014-02-17 DIAGNOSIS — R000 Tachycardia, unspecified: Secondary | ICD-10-CM | POA: Insufficient documentation

## 2014-02-17 DIAGNOSIS — Z791 Long term (current) use of non-steroidal anti-inflammatories (NSAID): Secondary | ICD-10-CM | POA: Diagnosis not present

## 2014-02-17 DIAGNOSIS — K625 Hemorrhage of anus and rectum: Secondary | ICD-10-CM | POA: Insufficient documentation

## 2014-02-17 DIAGNOSIS — Z8619 Personal history of other infectious and parasitic diseases: Secondary | ICD-10-CM | POA: Diagnosis not present

## 2014-02-17 LAB — CBC WITH DIFFERENTIAL/PLATELET
Basophils Absolute: 0 10*3/uL (ref 0.0–0.1)
Basophils Relative: 0 % (ref 0–1)
Eosinophils Absolute: 0.1 10*3/uL (ref 0.0–0.7)
Eosinophils Relative: 0 % (ref 0–5)
HEMATOCRIT: 43.5 % (ref 39.0–52.0)
Hemoglobin: 14.7 g/dL (ref 13.0–17.0)
Lymphocytes Relative: 15 % (ref 12–46)
Lymphs Abs: 2.2 10*3/uL (ref 0.7–4.0)
MCH: 28.9 pg (ref 26.0–34.0)
MCHC: 33.8 g/dL (ref 30.0–36.0)
MCV: 85.5 fL (ref 78.0–100.0)
MONO ABS: 1 10*3/uL (ref 0.1–1.0)
MONOS PCT: 7 % (ref 3–12)
NEUTROS ABS: 11.3 10*3/uL — AB (ref 1.7–7.7)
Neutrophils Relative %: 78 % — ABNORMAL HIGH (ref 43–77)
Platelets: 250 10*3/uL (ref 150–400)
RBC: 5.09 MIL/uL (ref 4.22–5.81)
RDW: 14.5 % (ref 11.5–15.5)
WBC: 14.6 10*3/uL — AB (ref 4.0–10.5)

## 2014-02-17 LAB — COMPREHENSIVE METABOLIC PANEL
ALK PHOS: 69 U/L (ref 39–117)
ALT: 24 U/L (ref 0–53)
ANION GAP: 7 (ref 5–15)
AST: 28 U/L (ref 0–37)
Albumin: 4.6 g/dL (ref 3.5–5.2)
BILIRUBIN TOTAL: 0.4 mg/dL (ref 0.3–1.2)
BUN: 20 mg/dL (ref 6–23)
CHLORIDE: 104 meq/L (ref 96–112)
CO2: 26 mmol/L (ref 19–32)
Calcium: 9.3 mg/dL (ref 8.4–10.5)
Creatinine, Ser: 1.35 mg/dL (ref 0.50–1.35)
GFR, EST AFRICAN AMERICAN: 72 mL/min — AB (ref >90)
GFR, EST NON AFRICAN AMERICAN: 62 mL/min — AB (ref >90)
Glucose, Bld: 99 mg/dL (ref 70–99)
POTASSIUM: 3.4 mmol/L — AB (ref 3.5–5.1)
Sodium: 137 mmol/L (ref 135–145)
Total Protein: 7.5 g/dL (ref 6.0–8.3)

## 2014-02-17 LAB — OCCULT BLOOD X 1 CARD TO LAB, STOOL: Fecal Occult Bld: POSITIVE — AB

## 2014-02-17 MED ORDER — HYDROCORTISONE ACETATE 25 MG RE SUPP: 25.0000 mg | Freq: Two times a day (BID) | RECTAL | Status: DC

## 2014-02-17 NOTE — ED Notes (Signed)
Reports rectal bleeding x 1 day. Reports straining when having BM today. ?hemorrhoid

## 2014-02-17 NOTE — ED Provider Notes (Signed)
CSN: [id number]     Arrival date & time 02/17/14  1714 History   First MD Initiated Contact with Patient 02/17/14 1800     Chief Complaint  Patient presents with  . Rectal Bleeding     (Consider location/radiation/quality/duration/timing/severity/associated sxs/prior Treatment) HPI Comments: Patient presents to the ER for evaluation of rectal bleeding. Patient reports that he had a bowel movement at work earlier and noticed blood on the tissue when he wiped. It was a normal bowel movement for him. After he went home from work, he had another bowel movement and this time there was red blood in the toilet. He has not had any abdominal pain or rectal pain. There is no nausea, vomiting. He has not had any fever. Patient denies any previous history of rectal bleeding. There is no history of colon cancer in his family.  Patient is a 44 y.o. male presenting with hematochezia.  Rectal Bleeding   Past Medical History  Diagnosis Date  . Hypertension     on meds since 1998   . Left ventricular hypertrophy     card work up [facility] ? inc QRS  . Chicken pox     As a child  . Arthritis     Both knees  . Migraines     Triggered by stress/tension  . Arrhythmia   . High cholesterol    Past Surgical History  Procedure Laterality Date  . Knee arthroplasty      left 2013 ligaments    Family History  Problem Relation Age of Onset  . Hypertension Mother   . Other Father     murdered    History  Substance Use Topics  . Smoking status: Current Every Day Smoker -- 0.25 packs/day for 30 years    Types: Cigarettes  . Smokeless tobacco: Never Used  . Alcohol Use: Yes     Comment: socially    Review of Systems  Gastrointestinal: Positive for blood in stool, hematochezia and anal bleeding.  All other systems reviewed and are negative.     Allergies  Penicillins  Home Medications   Prior to Admission medications   Medication Sig Start Date End Date Taking? Authorizing Provider   hydrochlorothiazide (HYDRODIURIL) 25 MG tablet TAKE 1 TABLET (25 MG TOTAL) BY MOUTH DAILY. 12/11/13   Burnis Medin, MD  ibuprofen (ADVIL,MOTRIN) 800 MG tablet Take 1 tablet (800 mg total) by mouth 3 (three) times daily. 11/14/13   Fransico Meadow, PA-C  lisinopril (PRINIVIL,ZESTRIL) 10 MG tablet TAKE 1 TABLET (10 MG TOTAL) BY MOUTH DAILY. 12/11/13   Burnis Medin, MD  methocarbamol (ROBAXIN) 500 MG tablet Take 1 tablet (500 mg total) by mouth 2 (two) times daily. 11/14/13   Fransico Meadow, PA-C   BP 118/79 mmHg  Pulse 102  Temp(Src) 97.9 F (36.6 C) (Oral)  Resp 18  SpO2 100% Physical Exam  Constitutional: He is oriented to person, place, and time. He appears well-developed and well-nourished. No distress.  HENT:  Head: Normocephalic and atraumatic.  Right Ear: Hearing normal.  Left Ear: Hearing normal.  Nose: Nose normal.  Mouth/Throat: Oropharynx is clear and moist and mucous membranes are normal.  Eyes: Conjunctivae and EOM are normal. Pupils are equal, round, and reactive to light.  Neck: Normal range of motion. Neck supple.  Cardiovascular: Regular rhythm, S1 normal and S2 normal.  Exam reveals no gallop and no friction rub.   No murmur heard. Pulmonary/Chest: Effort normal and breath sounds normal. No respiratory distress.  He exhibits no tenderness.  Abdominal: Soft. Normal appearance and bowel sounds are normal. There is no hepatosplenomegaly. There is no tenderness. There is no rebound, no guarding, no tenderness at McBurney's point and negative Murphy's sign. No hernia.  Musculoskeletal: Normal range of motion.  Neurological: He is alert and oriented to person, place, and time. He has normal strength. No cranial nerve deficit or sensory deficit. Coordination normal. GCS eye subscore is 4. GCS verbal subscore is 5. GCS motor subscore is 6.  Skin: Skin is warm, dry and intact. No rash noted. No cyanosis.  Psychiatric: He has a normal mood and affect. His speech is normal and  behavior is normal. Thought content normal.  Nursing note and vitals reviewed.   ED Course  Procedures (including critical care time) Labs Review Labs Reviewed  CBC WITH DIFFERENTIAL - Abnormal; Notable for the following:    WBC 14.6 (*)    Neutrophils Relative % 78 (*)    Neutro Abs 11.3 (*)    All other components within normal limits  COMPREHENSIVE METABOLIC PANEL - Abnormal; Notable for the following:    Potassium 3.4 (*)    GFR calc non Af Amer 62 (*)    GFR calc Af Amer 72 (*)    All other components within normal limits  OCCULT BLOOD X 1 CARD TO LAB, STOOL - Abnormal; Notable for the following:    Fecal Occult Bld POSITIVE (*)    All other components within normal limits    Imaging Review No results found.   EKG Interpretation None      MDM   Final diagnoses:  Rectal bleeding    Presents to the ER for evaluation of rectal bleeding. Patient has had 2 episodes of rectal bleeding today. The first time was a small amount of red blood on the tissue after he wiped, second time he had blood in the toilet. He has no previous history. Vital signs are normal, other than tachycardia, but patient is very anxious upon arrival. He has a normal hemoglobin. Blood pressure is normal. Rectal exam did not reveal any external hemorrhoids. Patient is appropriate for outpatient workup. Will treat with Anusol HC suppository for possible internal hemorrhoids, follow-up with GI.    Orpah Greek, MD 02/17/14 1859

## 2014-02-17 NOTE — ED Notes (Signed)
Patient asked to change into a gown.  

## 2014-02-17 NOTE — Discharge Instructions (Signed)
Rectal Bleeding °Rectal bleeding is when blood passes out of the anus. It is usually a sign that something is wrong. It may not be serious, but it should always be evaluated. Rectal bleeding may present as bright red blood or extremely dark stools. The color may range from dark red or maroon to black (like tar). It is important that the cause of rectal bleeding be identified so treatment can be started and the problem corrected. °CAUSES  °· Hemorrhoids. These are enlarged (dilated) blood vessels or veins in the anal or rectal area. °· Fistulas. These are abnormal, burrowing channels that usually run from inside the rectum to the skin around the anus. They can bleed. °· Anal fissures. This is a tear in the tissue of the anus. Bleeding occurs with bowel movements. °· Diverticulosis. This is a condition in which pockets or sacs project from the bowel wall. Occasionally, the sacs can bleed. °· Diverticulitis. This is an infection involving diverticulosis of the colon. °· Proctitis and colitis. These are conditions in which the rectum, colon, or both, can become inflamed and pitted (ulcerated). °· Polyps and cancer. Polyps are non-cancerous (benign) growths in the colon that may bleed. Certain types of polyps turn into cancer. °· Protrusion of the rectum. Part of the rectum can project from the anus and bleed. °· Certain medicines. °· Intestinal infections. °· Blood vessel abnormalities. °HOME CARE INSTRUCTIONS °· Eat a high-fiber diet to keep your stool soft. °· Limit activity. °· Drink enough fluids to keep your urine clear or pale yellow. °· Warm baths may be useful to soothe rectal pain. °· Follow up with your caregiver as directed. °SEEK IMMEDIATE MEDICAL CARE IF: °· You develop increased bleeding. °· You have black or dark red stools. °· You vomit blood or material that looks like coffee grounds. °· You have abdominal pain or tenderness. °· You have a fever. °· You feel weak, nauseous, or you faint. °· You have  severe rectal pain or you are unable to have a bowel movement. °MAKE SURE YOU: °· Understand these instructions. °· Will watch your condition. °· Will get help right away if you are not doing well or get worse. °Document Released: 07/14/2001 Document Revised: 04/16/2011 Document Reviewed: 07/09/2010 °[employer]® Patient Information ©2015 [employer]. This information is not intended to replace advice given to you by your health care provider. Make sure you discuss any questions you have with your health care provider. ° °

## 2014-02-18 ENCOUNTER — Telehealth: Payer: Self-pay | Admitting: Internal Medicine

## 2014-02-18 DIAGNOSIS — K625 Hemorrhage of anus and rectum: Secondary | ICD-10-CM

## 2014-02-18 NOTE — Telephone Encounter (Signed)
Pt went to med center yesterday w/ bleeding from his rectum.  They could not find anything wrong but pt states he is still bleeding when he goes to the restroom.  They advised pt t fu w/ a GI dr. Pt would like you to refer him to a GI doctor asap.  This has been going on 2 days. Pt not in pain, just a discomfort. Has not had a bm, Pt states he has been eating lots, but not going. Pt aware dr Regis Bill not in this afternoon.

## 2014-02-19 ENCOUNTER — Encounter: Payer: Self-pay | Admitting: Gastroenterology

## 2014-02-19 ENCOUNTER — Encounter: Payer: Self-pay | Admitting: Physician Assistant

## 2014-02-19 NOTE — Telephone Encounter (Signed)
Referral okay per Dr Regis Bill. Referral placed.

## 2014-02-24 ENCOUNTER — Ambulatory Visit (INDEPENDENT_AMBULATORY_CARE_PROVIDER_SITE_OTHER): Payer: BLUE CROSS/BLUE SHIELD | Admitting: Physician Assistant

## 2014-02-24 ENCOUNTER — Encounter: Payer: Self-pay | Admitting: Physician Assistant

## 2014-02-24 VITALS — BP 110/60 | HR 72 | Ht 68.0 in | Wt 273.8 lb

## 2014-02-24 DIAGNOSIS — K625 Hemorrhage of anus and rectum: Secondary | ICD-10-CM

## 2014-02-24 MED ORDER — MOVIPREP 100 G PO SOLR: 1.0000 | ORAL | Status: DC

## 2014-02-24 NOTE — Progress Notes (Addendum)
Patient ID: Justin [name], male   DOB: 1970/11/03, 44 y.o.   MRN: [id number]   Subjective:    Patient ID: Justin [name], male    DOB: [birth year], 44 y.o.   MRN: [id number]  HPI Justin [name] is a pleasant 44 year old African-American male new to GI today referred by Dr.Panosh for evaluation of rectal bleeding. He has history of hypertension obstructive sleep apnea and hyperlipidemia. He has not had any prior GI evaluation and no previous GI issues. He says he had acute onset on 02/17/2014 with bright red blood noted on the tissue after a bowel movement. He says later in the day several hours later he had another bowel movement, and felt like he was passing some liquid at the same time when he looked in the commode there was a lot of red blood in the water. He went to the emergency room for evaluation, the VBC was 14.6 hemoglobin 14.7 hematocrit of 43.5. He was noted to be heme positive and there was some red blood noted on exam though no hemorrhoids or other obvious source. He was asked to follow up with GI. He says he has not seen any further bleeding since that time. He denies any rectal pain or discomfort no changes in his bowel habits with normal bowel movements no complaints of abdominal pain or cramping. Appetite is been fine and weight has been stable. Family history is negative for colon cancer and polyps.  Review of Systems Pertinent positive and negative review of systems were noted in the above HPI section.  All other review of systems was otherwise negative.  Outpatient Encounter Prescriptions as of 02/24/2014  Medication Sig  . hydrochlorothiazide (HYDRODIURIL) 25 MG tablet TAKE 1 TABLET (25 MG TOTAL) BY MOUTH DAILY.  Marland Kitchen ibuprofen (ADVIL,MOTRIN) 800 MG tablet Take 800 mg by mouth every 8 (eight) hours as needed.  Marland Kitchen lisinopril (PRINIVIL,ZESTRIL) 10 MG tablet TAKE 1 TABLET (10 MG TOTAL) BY MOUTH DAILY.  . [DISCONTINUED] ibuprofen (ADVIL,MOTRIN) 800 MG tablet Take 1 tablet (800 mg  total) by mouth 3 (three) times daily. (Patient taking differently: Take 800 mg by mouth every 8 (eight) hours as needed. )  . MOVIPREP 100 G SOLR Take 1 kit (200 g total) by mouth as directed.  . [DISCONTINUED] hydrocortisone (ANUSOL-HC) 25 MG suppository Place 1 suppository (25 mg total) rectally 2 (two) times daily. For 7 days  . [DISCONTINUED] methocarbamol (ROBAXIN) 500 MG tablet Take 1 tablet (500 mg total) by mouth 2 (two) times daily.   Allergies  Allergen Reactions  . Penicillins Anaphylaxis    Pt can tolerate amoxicillin   Patient Active Problem List   Diagnosis Date Noted  . Bilateral groin pain 11/30/2013  . Lumbago 11/30/2013  . Urinary frequency 11/30/2013  . Lower urinary tract symptoms (LUTS) 11/30/2013  . Right-sided low back pain without sciatica 11/23/2013  . Achilles tendinitis of right lower extremity 09/23/2013  . Hyperglycemia 08/12/2013  . OSA (obstructive sleep apnea) 08/12/2013  . Morbid obesity 08/12/2013  . Hypertension   . Left ventricular hypertrophy   . High cholesterol    History   Social History  . Marital Status: Single    Spouse Name: N/A    Number of Children: N/A  . Years of Education: N/A   Occupational History  . 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 Smoking status: Current Every Day Smoker -- 0.25 packs/day for 30 years    Types: Cigarettes  . Smokeless tobacco: Never  Used     Comment: tobacco info given 02/24/14  . Alcohol Use: 0.0 oz/week    0 Not specified per week     Comment: rarely  . Drug Use: No  . Sexual Activity: Not on file   Other Topics Concern  . Not on file   Social History Narrative   8 hours of sleep per night   Lives with his girlfriend and step-daughter.   College/Maintenance Tech/Works full time   [employer]    physically active  Job  Sometimes 62- 62    Tobacco age 61  stopped or a month .   etoh  less than 76 per week.    Sodas  And sugar sodas  7-8 per day and  then came down  To 1 day.    orig from [place]   Fa safety takes vitamins                   Mr. Matsuo family history includes Breast cancer in his maternal aunt and maternal grandmother; Hypertension in his mother; Other in his father. There is no history of Colon cancer.      Objective:    Filed Vitals:   02/24/14 0920  BP: 110/60  Pulse: 72    Physical Exam well-developed African American male in no acute distress, pleasant blood pressure 110/60 pulse 72 height 5 foot 8 weight 273 BMI of 41. HEENT; nontraumatic normocephalic EOMI PERRLA sclera anicteric, Supple; no JVD, Cardiovascular; regular rate and rhythm with S1-S2 no murmur or gallop, Pulmonary; clear bilaterally, Abdomen; is are present no palpable mass or hepatosplenomegaly nontender, Rectal; exam is not repeated today this was done in the emergency room on 02/17/2014 by ER M.D. no external lesions noted she there was some bright red blood on the examining glove, Extremities ;no clubbing cyanosis or edema skin warm and dry, Psych; mood and affect appropriate       Assessment & Plan:   #1 44 yo AA male with recent episode or BRB per rectum x 2. Rule out occult colon lesion versus internal hemorrhoids. He has not had recurrence since January 13. #2 hypertension #3 sleep apnea #4 hyperlipidemia #5 obesity  Plan; patient is scheduled for colonoscopy with Dr. Hilarie Fredrickson. Procedure discussed in detail with the patient and he is agreeable to proceed. Further plans pending findings at colonoscopy.   Amy S Esterwood PA-C 02/24/2014  Addendum: Reviewed and agree with initial management. Jerene Bears, MD

## 2014-02-24 NOTE — Patient Instructions (Addendum)
You have been given a separate informational sheet regarding your tobacco use, the importance of quitting and local resources to help you quit.  You have been scheduled for a colonoscopy. Please follow written instructions given to you at your visit today.  Please pick up your prep kit at the pharmacy within the next 1-3 days. If you use inhalers (even only as needed), please bring them with you on the day of your procedure. Your physician has requested that you go to www.startemmi.com and enter the access code given to you at your visit today. This web site gives a general overview about your procedure. However, you should still follow specific instructions given to you by our office regarding your preparation for the procedure.  

## 2014-03-01 ENCOUNTER — Other Ambulatory Visit: Payer: Self-pay

## 2014-03-01 ENCOUNTER — Telehealth: Payer: Self-pay | Admitting: Physician Assistant

## 2014-03-01 NOTE — Telephone Encounter (Signed)
Note faxed.

## 2014-03-02 ENCOUNTER — Ambulatory Visit (AMBULATORY_SURGERY_CENTER): Payer: BLUE CROSS/BLUE SHIELD | Admitting: Internal Medicine

## 2014-03-02 ENCOUNTER — Encounter: Payer: Self-pay | Admitting: Internal Medicine

## 2014-03-02 VITALS — BP 127/89 | HR 61 | Temp 97.5°F | Resp 14 | Ht 68.0 in | Wt 273.0 lb

## 2014-03-02 DIAGNOSIS — D123 Benign neoplasm of transverse colon: Secondary | ICD-10-CM

## 2014-03-02 DIAGNOSIS — D125 Benign neoplasm of sigmoid colon: Secondary | ICD-10-CM

## 2014-03-02 DIAGNOSIS — K625 Hemorrhage of anus and rectum: Secondary | ICD-10-CM

## 2014-03-02 MED ORDER — SODIUM CHLORIDE 0.9 % IV SOLN: 500.0000 mL | INTRAVENOUS | Status: DC

## 2014-03-02 NOTE — Progress Notes (Signed)
Called to room to assist during endoscopic procedure.  Patient ID and intended procedure confirmed with present staff. Received instructions for my participation in the procedure from the performing physician.  

## 2014-03-02 NOTE — Op Note (Signed)
[place]  [address]. [place], [address]   COLONOSCOPY PROCEDURE REPORT  PATIENT: Justin, [name]  MR#: [id number] BIRTHDATE: 02-21-1970 , 86  yrs. old GENDER: male ENDOSCOPIST: Jerene Bears, MD REFERRED TD:[facility] Darnelle Going, M.D. PROCEDURE DATE:  03/02/2014 PROCEDURE:   Colonoscopy with cold biopsy polypectomy First Screening Colonoscopy - Avg.  risk and is 50 yrs.  old or older - No.  Prior Negative Screening - Now for repeat screening. N/A  History of Adenoma - Now for follow-up colonoscopy & has been > or = to 3 yrs.  N/A  Polyps Removed Today? Yes. ASA CLASS:   Class II INDICATIONS:rectal bleeding. MEDICATIONS: Monitored anesthesia care, Propofol 270 mg IV, and lidocaine 150 mg IV  DESCRIPTION OF PROCEDURE:   After the risks benefits and alternatives of the procedure were thoroughly explained, informed consent was obtained.  The digital rectal exam revealed no rectal mass.   The LB CF-H180AL Loaner [id number]  endoscope was introduced through the anus and advanced to the cecum, which was identified by both the appendix and ileocecal valve. No adverse events experienced.   The quality of the prep was good, using MoviPrep The instrument was then slowly withdrawn as the colon was fully examined.  COLON FINDINGS: Three sessile polyps ranging from 3 to 82mm in size were found in the sigmoid colon and transverse colon. Polypectomies were performed with cold forceps.  The resection was complete, the polyp tissue was completely retrieved and sent to histology.   There was mild diverticulosis noted in the ascending colon.   The examination was otherwise normal.  Retroflexed views revealed internal hemorrhoids. The time to cecum=2 minutes 59 seconds.  Withdrawal time=12 minutes 48 seconds.  The scope was withdrawn and the procedure completed.  COMPLICATIONS: There were no immediate complications.  ENDOSCOPIC IMPRESSION: 1.   Three sessile polyps ranging from  3 to 48mm in size were found in the sigmoid colon and transverse colon; polypectomies were performed with cold forceps 2.   Mild diverticulosis was noted in the ascending colon 3.   The examination was otherwise normal  RECOMMENDATIONS: 1.  Await pathology results 2.  High fiber diet 3.  If rectal bleeding returns persistently, would consider hemorrhoidal banding 4.  Timing of repeat colonoscopy will be determined by pathology findings. 5.  You will receive a letter within 1-2 weeks with the results of your biopsy as well as final recommendations.  Please call my office if you have not received a letter after 3 weeks.  eSigned:  Jerene Bears, MD 2014-03-02 [phone]   cc: Burnis Medin, MD and The Patient   PATIENT NAME:  Justin, [name] MR#: [id number]

## 2014-03-02 NOTE — Patient Instructions (Signed)
YOU HAD AN ENDOSCOPIC PROCEDURE TODAY AT THE [facility]: Refer to the procedure report that was given to you for any specific questions about what was found during the examination.  If the procedure report does not answer your questions, please call your gastroenterologist to clarify.  If you requested that your care partner not be given the details of your procedure findings, then the procedure report has been included in a sealed envelope for you to review at your convenience later.  YOU SHOULD EXPECT: Some feelings of bloating in the abdomen. Passage of more gas than usual.  Walking can help get rid of the air that was put into your GI tract during the procedure and reduce the bloating. If you had a lower endoscopy (such as a colonoscopy or flexible sigmoidoscopy) you may notice spotting of blood in your stool or on the toilet paper. If you underwent a bowel prep for your procedure, then you may not have a normal bowel movement for a few days.  DIET: Your first meal following the procedure should be a light meal and then it is ok to progress to your normal diet.  A half-sandwich or bowl of soup is an example of a good first meal.  Heavy or fried foods are harder to digest and may make you feel nauseous or bloated.  Likewise meals heavy in dairy and vegetables can cause extra gas to form and this can also increase the bloating.  Drink plenty of fluids but you should avoid alcoholic beverages for 24 hours.  ACTIVITY: Your care partner should take you home directly after the procedure.  You should plan to take it easy, moving slowly for the rest of the day.  You can resume normal activity the day after the procedure however you should NOT DRIVE or use heavy machinery for 24 hours (because of the sedation medicines used during the test).    SYMPTOMS TO REPORT IMMEDIATELY: A gastroenterologist can be reached at any hour.  During normal business hours, 8:30 AM to 5:00 PM Monday through Friday,  call [phone].  After hours and on weekends, please call the GI answering service at [phone] who will take a message and have the physician on call contact you.   Following lower endoscopy (colonoscopy or flexible sigmoidoscopy):  Excessive amounts of blood in the stool  Significant tenderness or worsening of abdominal pains  Swelling of the abdomen that is new, acute  Fever of 100F or higher   FOLLOW UP: If any biopsies were taken you will be contacted by phone or by letter within the next 1-3 weeks.  Call your gastroenterologist if you have not heard about the biopsies in 3 weeks.  Our staff will call the home number listed on your records the next business day following your procedure to check on you and address any questions or concerns that you may have at that time regarding the information given to you following your procedure. This is a courtesy call and so if there is no answer at the home number and we have not heard from you through the emergency physician on call, we will assume that you have returned to your regular daily activities without incident.  SIGNATURES/CONFIDENTIALITY: You and/or your care partner have signed paperwork which will be entered into your electronic medical record.  These signatures attest to the fact that that the information above on your After Visit Summary has been reviewed and is understood.  Full responsibility of the confidentiality of   this discharge information lies with you and/or your care-partner.    Resume medications. Information given on polyps, diverticulosis,hemorrhoids and high fiber diet with discharge instructions. 

## 2014-03-02 NOTE — Progress Notes (Signed)
Report to PACU, RN, vss, BBS= Clear.  

## 2014-03-03 ENCOUNTER — Telehealth: Payer: Self-pay | Admitting: *Deleted

## 2014-03-03 NOTE — Telephone Encounter (Signed)
  Follow up Call-  Call back number 03/02/2014  Post procedure Call Back phone  # [phone]  Permission to leave phone message Yes     Patient questions:  Do you have a fever, pain , or abdominal swelling? No. Pain Score  0 *  Have you tolerated food without any problems? Yes.    Have you been able to return to your normal activities? Yes.    Do you have any questions about your discharge instructions: Diet   No. Medications  No. Follow up visit  No.  Do you have questions or concerns about your Care? No.  Actions: * If pain score is 4 or above: No action needed, pain <4.

## 2014-03-08 ENCOUNTER — Telehealth: Payer: Self-pay | Admitting: Internal Medicine

## 2014-03-08 NOTE — Telephone Encounter (Signed)
Letter faxed per pt request. Left him a message to call and let me know if he wants a copy for his wife to pick up.

## 2014-03-09 ENCOUNTER — Encounter: Payer: Self-pay | Admitting: Internal Medicine

## 2014-05-07 ENCOUNTER — Other Ambulatory Visit: Payer: Self-pay | Admitting: Internal Medicine

## 2014-05-07 NOTE — Telephone Encounter (Signed)
Ok to refill x 6 months   Need cpx with labs before runs out  Lab Results  Component Value Date   WBC 14.6* 02/17/2014   HGB 14.7 02/17/2014   HCT 43.5 02/17/2014   PLT 250 02/17/2014   GLUCOSE 99 02/17/2014   ALT 24 02/17/2014   AST 28 02/17/2014   NA 137 02/17/2014   K 3.4* 02/17/2014   CL 104 02/17/2014   CREATININE 1.35 02/17/2014   BUN 20 02/17/2014   CO2 26 02/17/2014   HGBA1C 6.2 09/16/2013   BP Readings from Last 3 Encounters:  03/02/14 127/89  02/24/14 110/60  02/17/14 132/82

## 2014-05-07 NOTE — Telephone Encounter (Signed)
Sent to the pharmacy by e-scribe. 

## 2015-01-27 ENCOUNTER — Emergency Department (HOSPITAL_BASED_OUTPATIENT_CLINIC_OR_DEPARTMENT_OTHER)
Admission: EM | Admit: 2015-01-27 | Discharge: 2015-01-27 | Disposition: A | Discharge status: Home / Self Care | Payer: BLUE CROSS/BLUE SHIELD | Attending: Emergency Medicine | Admitting: Emergency Medicine

## 2015-01-27 ENCOUNTER — Emergency Department (HOSPITAL_BASED_OUTPATIENT_CLINIC_OR_DEPARTMENT_OTHER): Payer: BLUE CROSS/BLUE SHIELD

## 2015-01-27 ENCOUNTER — Encounter (HOSPITAL_BASED_OUTPATIENT_CLINIC_OR_DEPARTMENT_OTHER): Payer: Self-pay

## 2015-01-27 DIAGNOSIS — Z8639 Personal history of other endocrine, nutritional and metabolic disease: Secondary | ICD-10-CM | POA: Insufficient documentation

## 2015-01-27 DIAGNOSIS — I10 Essential (primary) hypertension: Secondary | ICD-10-CM | POA: Insufficient documentation

## 2015-01-27 DIAGNOSIS — G43909 Migraine, unspecified, not intractable, without status migrainosus: Secondary | ICD-10-CM | POA: Insufficient documentation

## 2015-01-27 DIAGNOSIS — R05 Cough: Secondary | ICD-10-CM

## 2015-01-27 DIAGNOSIS — B349 Viral infection, unspecified: Secondary | ICD-10-CM

## 2015-01-27 DIAGNOSIS — F17210 Nicotine dependence, cigarettes, uncomplicated: Secondary | ICD-10-CM | POA: Insufficient documentation

## 2015-01-27 DIAGNOSIS — R059 Cough, unspecified: Secondary | ICD-10-CM

## 2015-01-27 DIAGNOSIS — M160 Bilateral primary osteoarthritis of hip: Secondary | ICD-10-CM | POA: Insufficient documentation

## 2015-01-27 DIAGNOSIS — Z79899 Other long term (current) drug therapy: Secondary | ICD-10-CM | POA: Insufficient documentation

## 2015-01-27 DIAGNOSIS — Z8669 Personal history of other diseases of the nervous system and sense organs: Secondary | ICD-10-CM | POA: Insufficient documentation

## 2015-01-27 DIAGNOSIS — Z880 Allergy status to penicillin: Secondary | ICD-10-CM | POA: Insufficient documentation

## 2015-01-27 MED ORDER — HYDROCODONE-ACETAMINOPHEN 5-325 MG PO TABS
1.0000 | ORAL_TABLET | Freq: Once | ORAL | Status: AC
  Administered 2015-01-27: 1 via ORAL
  Filled 2015-01-27: qty 1

## 2015-01-27 MED ORDER — BENZONATATE 100 MG PO CAPS: 100.0000 mg | ORAL_CAPSULE | Freq: Three times a day (TID) | ORAL | Status: DC

## 2015-01-27 MED ORDER — ACETAMINOPHEN 325 MG PO TABS
650.0000 mg | ORAL_TABLET | Freq: Once | ORAL | Status: AC
  Administered 2015-01-27: 650 mg via ORAL
  Filled 2015-01-27: qty 2

## 2015-01-27 MED ORDER — ONDANSETRON 4 MG PO TBDP: 4.0000 mg | ORAL_TABLET | Freq: Three times a day (TID) | ORAL | Status: DC | PRN

## 2015-01-27 MED ORDER — HYDROCODONE-ACETAMINOPHEN 5-325 MG PO TABS: 2.0000 | ORAL_TABLET | ORAL | Status: DC | PRN

## 2015-01-27 NOTE — Discharge Instructions (Signed)
Cough, Adult Coughing is a reflex that clears your throat and your airways. Coughing helps to heal and protect your lungs. It is normal to cough occasionally, but a cough that happens with other symptoms or lasts a long time may be a sign of a condition that needs treatment. A cough may last only 2-3 weeks (acute), or it may last longer than 8 weeks (chronic). CAUSES Coughing is commonly caused by:  Breathing in substances that irritate your lungs.  A viral or bacterial respiratory infection.  Allergies.  Asthma.  Postnasal drip.  Smoking.  Acid backing up from the stomach into the esophagus (gastroesophageal reflux).  Certain medicines.  Chronic lung problems, including COPD (or rarely, lung cancer).  Other medical conditions such as heart failure. HOME CARE INSTRUCTIONS  Pay attention to any changes in your symptoms. Take these actions to help with your discomfort:  Take medicines only as told by your health care provider.  If you were prescribed an antibiotic medicine, take it as told by your health care provider. Do not stop taking the antibiotic even if you start to feel better.  Talk with your health care provider before you take a cough suppressant medicine.  Drink enough fluid to keep your urine clear or pale yellow.  If the air is dry, use a cold steam vaporizer or humidifier in your bedroom or your home to help loosen secretions.  Avoid anything that causes you to cough at work or at home.  If your cough is worse at night, try sleeping in a semi-upright position.  Avoid cigarette smoke. If you smoke, quit smoking. If you need help quitting, ask your health care provider.  Avoid caffeine.  Avoid alcohol.  Rest as needed. SEEK MEDICAL CARE IF:   You have new symptoms.  You cough up pus.  Your cough does not get better after 2-3 weeks, or your cough gets worse.  You cannot control your cough with suppressant medicines and you are losing sleep.  You  develop pain that is getting worse or pain that is not controlled with pain medicines.  You have a fever.  You have unexplained weight loss.  You have night sweats. SEEK IMMEDIATE MEDICAL CARE IF:  You cough up blood.  You have difficulty breathing.  Your heartbeat is very fast.   This information is not intended to replace advice given to you by your health care provider. Make sure you discuss any questions you have with your health care provider.   Document Released: 07/21/2010 Document Revised: 10/13/2014 Document Reviewed: 03/31/2014 Elsevier Interactive Patient Education 2016 [employer]  Viral Infections A viral infection can be caused by different types of viruses.Most viral infections are not serious and resolve on their own. However, some infections may cause severe symptoms and may lead to further complications. SYMPTOMS Viruses can frequently cause:  Minor sore throat.  Aches and pains.  Headaches.  Runny nose.  Different types of rashes.  Watery eyes.  Tiredness.  Cough.  Loss of appetite.  Gastrointestinal infections, resulting in nausea, vomiting, and diarrhea. These symptoms do not respond to antibiotics because the infection is not caused by bacteria. However, you might catch a bacterial infection following the viral infection. This is sometimes called a "superinfection." Symptoms of such a bacterial infection may include:  Worsening sore throat with pus and difficulty swallowing.  Swollen neck glands.  Chills and a high or persistent fever.  Severe headache.  Tenderness over the sinuses.  Persistent overall ill feeling (malaise), muscle  aches, and tiredness (fatigue).  Persistent cough.  Yellow, green, or brown mucus production with coughing. HOME CARE INSTRUCTIONS   Only take over-the-counter or prescription medicines for pain, discomfort, diarrhea, or fever as directed by your caregiver.  Drink enough water and fluids to keep  your urine clear or pale yellow. Sports drinks can provide valuable electrolytes, sugars, and hydration.  Get plenty of rest and maintain proper nutrition. Soups and broths with crackers or rice are fine. SEEK IMMEDIATE MEDICAL CARE IF:   You have severe headaches, shortness of breath, chest pain, neck pain, or an unusual rash.  You have uncontrolled vomiting, diarrhea, or you are unable to keep down fluids.  You or your child has an oral temperature above 102 F (38.9 C), not controlled by medicine.  Your baby is older than 3 months with a rectal temperature of 102 F (38.9 C) or higher.  Your baby is 80 months old or younger with a rectal temperature of 100.4 F (38 C) or higher. MAKE SURE YOU:   Understand these instructions.  Will watch your condition.  Will get help right away if you are not doing well or get worse.   This information is not intended to replace advice given to you by your health care provider. Make sure you discuss any questions you have with your health care provider.   Document Released: 11/01/2004 Document Revised: 04/16/2011 Document Reviewed: 06/30/2014 Elsevier Interactive Patient Education [employer].

## 2015-01-27 NOTE — ED Notes (Signed)
Cough, nausea, body aches and subjective fever since yesterday, tried OTC meds without relief.

## 2015-01-27 NOTE — ED Provider Notes (Addendum)
CSN: [id number]     Arrival date & time 01/27/15  1956 History  By signing my name below, I, Jolayne Panther, attest that this documentation has been prepared under the direction and in the presence of Tanna Furry, MD. Electronically Signed: Jolayne Panther, Scribe. 01/27/2015. 9:44 PM.     Chief Complaint  Patient presents with  . Cough   The history is provided by the patient. No language interpreter was used.   HPI Comments: Justin [name] is a 44 y.o. male who presents to the Emergency Department complaining of onset of generalized body aches after two and a half days of being sick. He complains of associated congestion and nausea today. Pt notes his fingertips and lips became numb and then he had an itchy throat and cough. He also reports that when he coughs he feels the mucous in his chest but is unable to get it to come up. Pt reports a temperature of 99 measured at home. Pt's temperature is currently 102.2 in the ED. Pt can not remember the last time he had a flu shot. deneis vomiting   Past Medical History  Diagnosis Date  . Hypertension     on meds since 1998   . Left ventricular hypertrophy     card work up [facility] ? inc QRS  . Chicken pox     As a child  . Arthritis     Both knees  . Migraines     Triggered by stress/tension  . Arrhythmia   . High cholesterol   . OSA (obstructive sleep apnea)    Past Surgical History  Procedure Laterality Date  . Knee arthroplasty Left     left 2013 ligaments    Family History  Problem Relation Age of Onset  . Hypertension Mother   . Other Father     murdered   . Breast cancer Maternal Grandmother   . Breast cancer Maternal Aunt     x 3  . Colon cancer Neg Hx    Social History  Substance Use Topics  . Smoking status: Current Every Day Smoker -- 0.25 packs/day for 30 years    Types: Cigarettes  . Smokeless tobacco: Never Used     Comment: tobacco info given 02/24/14  . Alcohol Use: 0.0 oz/week    0 Standard  drinks or equivalent per week     Comment: rarely    Review of Systems  Constitutional: Negative for fever, chills, diaphoresis, appetite change and fatigue.  HENT: Negative for mouth sores, sore throat and trouble swallowing.   Eyes: Negative for visual disturbance.  Respiratory: Negative for cough, chest tightness, shortness of breath and wheezing.   Cardiovascular: Negative for chest pain.  Gastrointestinal: Negative for nausea, vomiting, abdominal pain, diarrhea and abdominal distention.  Endocrine: Negative for polydipsia, polyphagia and polyuria.  Genitourinary: Negative for dysuria, frequency and hematuria.  Musculoskeletal: Negative for gait problem.  Skin: Negative for color change, pallor and rash.  Neurological: Negative for dizziness, syncope, light-headedness and headaches.  Hematological: Does not bruise/bleed easily.  Psychiatric/Behavioral: Negative for behavioral problems and confusion.      Allergies  Penicillins  Home Medications   Prior to Admission medications   Medication Sig Start Date End Date Taking? Authorizing Provider  benzonatate (TESSALON) 100 MG capsule Take 1 capsule (100 mg total) by mouth every 8 (eight) hours. 01/27/15   Tanna Furry, MD  hydrochlorothiazide (HYDRODIURIL) 25 MG tablet TAKE 1 TABLET (25 MG TOTAL) BY MOUTH DAILY. 05/07/14  Burnis Medin, MD  HYDROcodone-acetaminophen (NORCO/VICODIN) 5-325 MG tablet Take 2 tablets by mouth every 4 (four) hours as needed. 01/27/15   Tanna Furry, MD  hydrocortisone (ANUSOL-HC) 25 MG suppository  02/17/14   Historical Provider, MD  ibuprofen (ADVIL,MOTRIN) 800 MG tablet Take 800 mg by mouth every 8 (eight) hours as needed.    Historical Provider, MD  lisinopril (PRINIVIL,ZESTRIL) 10 MG tablet TAKE 1 TABLET (10 MG TOTAL) BY MOUTH DAILY. 05/07/14   Burnis Medin, MD  ondansetron (ZOFRAN ODT) 4 MG disintegrating tablet Take 1 tablet (4 mg total) by mouth every 8 (eight) hours as needed for nausea. 01/27/15    Tanna Furry, MD   BP 127/92 mmHg  Pulse 99  Temp(Src) 99.3 F (37.4 C) (Oral)  Resp 18  Ht 5\' 9"  (1.753 m)  Wt 285 lb (129.275 kg)  BMI 42.07 kg/m2  SpO2 96% Physical Exam  Constitutional: He is oriented to person, place, and time. He appears well-developed and well-nourished. No distress.  Temperature of 102.2   HENT:  Head: Normocephalic.  Eyes: Conjunctivae are normal. Pupils are equal, round, and reactive to light. No scleral icterus.  Neck: Normal range of motion. Neck supple. No thyromegaly present.  Cardiovascular: Normal rate and regular rhythm.  Exam reveals no gallop and no friction rub.   No murmur heard. Pulmonary/Chest: Effort normal and breath sounds normal. No respiratory distress. He has no wheezes. He has no rales.  Abdominal: Soft. Bowel sounds are normal. He exhibits no distension. There is no tenderness. There is no rebound.  Musculoskeletal: Normal range of motion.  Neurological: He is alert and oriented to person, place, and time.  Skin: Skin is warm and dry. No rash noted.  Psychiatric: He has a normal mood and affect. His behavior is normal.    ED Course  Procedures  DIAGNOSTIC STUDIES:    Oxygen Saturation is 96% on RA, adequate by my interpretation.   COORDINATION OF CARE:  9:44 PM Will administer pt ibuprofen and vidkoden in the ED. Will prescribe pt medication at discharge. Advise pt to continue resting, taking medication, and drinking fluids. Advise pt to return to the ED if symptoms worsen. Discussed treatment plan with pt at bedside and pt agreed to plan.   Imaging Review Dg Chest 2 View  01/27/2015  CLINICAL DATA:  Nonproductive cough.  Fever. EXAM: CHEST  2 VIEW COMPARISON:  02/17/2014 FINDINGS: Few densities at the left lung base may represent atelectasis. Otherwise, the lungs are clear. Air-fluid level in the stomach. No pleural effusions. No acute bone abnormality. Heart size is normal and stable. IMPRESSION: Mild left basilar atelectasis.  No significant airspace disease or consolidation. Electronically Signed   By: Markus Daft M.D.   On: 01/27/2015 20:52   I have personally reviewed and evaluated these images as part of my medical decision-making.   EKG Interpretation None      MDM   Final diagnoses:  Viral syndrome  Cough   I personally performed the services described in this documentation, which was scribed in my presence. The recorded information has been reviewed and is accurate.   Patient not septic, toxic. Well oxygenated. Recheck temp is 102.2. Benign pharynx clear lungs well oxygenated with normal x-ray. Plan is symptomatically treatment. He is not influenza immunized. He is 3 days into his symptoms. Given return precautions. Plan symptomatically treatment. Rest, fluids, expectant management.   Tanna Furry, MD 01/27/15 2144  Tanna Furry, MD 01/27/15 [phone]

## 2016-03-30 DIAGNOSIS — Y929 Unspecified place or not applicable: Secondary | ICD-10-CM | POA: Insufficient documentation

## 2016-03-30 DIAGNOSIS — T1502XA Foreign body in cornea, left eye, initial encounter: Secondary | ICD-10-CM | POA: Insufficient documentation

## 2016-03-30 DIAGNOSIS — Y9389 Activity, other specified: Secondary | ICD-10-CM | POA: Insufficient documentation

## 2016-03-30 DIAGNOSIS — I10 Essential (primary) hypertension: Secondary | ICD-10-CM | POA: Insufficient documentation

## 2016-03-30 DIAGNOSIS — F17210 Nicotine dependence, cigarettes, uncomplicated: Secondary | ICD-10-CM | POA: Insufficient documentation

## 2016-03-30 DIAGNOSIS — Z79899 Other long term (current) drug therapy: Secondary | ICD-10-CM | POA: Insufficient documentation

## 2016-03-30 DIAGNOSIS — W228XXA Striking against or struck by other objects, initial encounter: Secondary | ICD-10-CM | POA: Insufficient documentation

## 2016-03-30 DIAGNOSIS — Y990 Civilian activity done for income or pay: Secondary | ICD-10-CM | POA: Insufficient documentation

## 2016-03-31 ENCOUNTER — Emergency Department (HOSPITAL_BASED_OUTPATIENT_CLINIC_OR_DEPARTMENT_OTHER): Payer: Self-pay

## 2016-03-31 ENCOUNTER — Encounter (HOSPITAL_BASED_OUTPATIENT_CLINIC_OR_DEPARTMENT_OTHER): Payer: Self-pay | Admitting: Emergency Medicine

## 2016-03-31 ENCOUNTER — Encounter (HOSPITAL_BASED_OUTPATIENT_CLINIC_OR_DEPARTMENT_OTHER): Payer: Self-pay | Admitting: *Deleted

## 2016-03-31 ENCOUNTER — Telehealth (HOSPITAL_BASED_OUTPATIENT_CLINIC_OR_DEPARTMENT_OTHER): Payer: Self-pay | Admitting: *Deleted

## 2016-03-31 ENCOUNTER — Emergency Department (HOSPITAL_BASED_OUTPATIENT_CLINIC_OR_DEPARTMENT_OTHER)
Admission: EM | Admit: 2016-03-31 | Discharge: 2016-04-01 | Disposition: A | Discharge status: Home / Self Care | Payer: Self-pay | Attending: Emergency Medicine | Admitting: Emergency Medicine

## 2016-03-31 ENCOUNTER — Emergency Department (HOSPITAL_BASED_OUTPATIENT_CLINIC_OR_DEPARTMENT_OTHER)
Admission: EM | Admit: 2016-03-31 | Discharge: 2016-03-31 | Disposition: A | Discharge status: Home / Self Care | Payer: BLUE CROSS/BLUE SHIELD | Attending: Emergency Medicine | Admitting: Emergency Medicine

## 2016-03-31 DIAGNOSIS — I10 Essential (primary) hypertension: Secondary | ICD-10-CM | POA: Insufficient documentation

## 2016-03-31 DIAGNOSIS — T1502XA Foreign body in cornea, left eye, initial encounter: Secondary | ICD-10-CM

## 2016-03-31 DIAGNOSIS — F17210 Nicotine dependence, cigarettes, uncomplicated: Secondary | ICD-10-CM | POA: Insufficient documentation

## 2016-03-31 DIAGNOSIS — Y929 Unspecified place or not applicable: Secondary | ICD-10-CM | POA: Insufficient documentation

## 2016-03-31 DIAGNOSIS — H18892 Other specified disorders of cornea, left eye: Secondary | ICD-10-CM

## 2016-03-31 DIAGNOSIS — S0502XA Injury of conjunctiva and corneal abrasion without foreign body, left eye, initial encounter: Secondary | ICD-10-CM

## 2016-03-31 DIAGNOSIS — W208XXA Other cause of strike by thrown, projected or falling object, initial encounter: Secondary | ICD-10-CM | POA: Insufficient documentation

## 2016-03-31 DIAGNOSIS — Y999 Unspecified external cause status: Secondary | ICD-10-CM | POA: Insufficient documentation

## 2016-03-31 DIAGNOSIS — Y9389 Activity, other specified: Secondary | ICD-10-CM | POA: Insufficient documentation

## 2016-03-31 MED ORDER — GATIFLOXACIN 0.5 % OP SOLN: 1.0000 [drp] | Freq: Four times a day (QID) | OPHTHALMIC | 0 refills | Status: DC

## 2016-03-31 MED ORDER — ERYTHROMYCIN 5 MG/GM OP OINT
TOPICAL_OINTMENT | Freq: Four times a day (QID) | OPHTHALMIC | Status: DC
  Administered 2016-03-31: 02:00:00 via OPHTHALMIC
  Filled 2016-03-31: qty 3.5

## 2016-03-31 MED ORDER — TETRACAINE HCL 0.5 % OP SOLN
2.0000 [drp] | Freq: Once | OPHTHALMIC | Status: AC
  Administered 2016-03-31: 2 [drp] via OPHTHALMIC
  Filled 2016-03-31: qty 4

## 2016-03-31 MED ORDER — FLUORESCEIN SODIUM 0.6 MG OP STRP
1.0000 | ORAL_STRIP | Freq: Once | OPHTHALMIC | Status: DC
  Filled 2016-03-31: qty 1

## 2016-03-31 MED ORDER — FLUORESCEIN SODIUM 0.6 MG OP STRP
1.0000 | ORAL_STRIP | Freq: Once | OPHTHALMIC | Status: AC
  Administered 2016-03-31: 1 via OPHTHALMIC
  Filled 2016-03-31: qty 1

## 2016-03-31 MED ORDER — HYDROCODONE-ACETAMINOPHEN 5-325 MG PO TABS
1.0000 | ORAL_TABLET | Freq: Once | ORAL | Status: AC
  Administered 2016-03-31: 1 via ORAL
  Filled 2016-03-31: qty 1

## 2016-03-31 MED ORDER — HYDROCODONE-ACETAMINOPHEN 5-325 MG PO TABS: 1.0000 | ORAL_TABLET | Freq: Four times a day (QID) | ORAL | 0 refills | Status: DC | PRN

## 2016-03-31 NOTE — ED Notes (Signed)
Pt being transferred to [facility] ED to see Eye Doc. NAD.

## 2016-03-31 NOTE — ED Notes (Signed)
Pt's guest requested blanket for patient - gave blanket with nurse's approval

## 2016-03-31 NOTE — ED Provider Notes (Signed)
[place] DEPT [facility] Provider Note   CSN: [id number] Arrival date & time: 03/31/16  1703  By signing my name below, I, Jeanell Sparrow, attest that this documentation has been prepared under the direction and in the presence of Gareth Morgan, MD. Electronically Signed: Jeanell Sparrow, Scribe. 03/31/2016. 6:20 PM.  History   Chief Complaint Chief Complaint  Patient presents with  . Eye Pain   The history is provided by the patient. No language interpreter was used.   HPI Comments: Justin [name] is a 46 y.o. male who presents to the Emergency Department complaining of constant moderate left eye pain that started yesterday. He came to the ED last night for the same complaint due to a piece of metal in his left medial cornea. Rust ring still in place. He is supposed to follow-up with a specialist for removal. He returned today due to worsening pain in 2 days. He rates his pain as a 7/10. He reports associated blurry vision in his left eye, tearing. He denies any other complaints.     PCP: Lottie Dawson, MD  Past Medical History:  Diagnosis Date  . Arrhythmia   . Arthritis    Both knees  . Chicken pox    As a child  . High cholesterol   . Hypertension    on meds since 1998   . Left ventricular hypertrophy    card work up [facility] ? inc QRS  . Migraines    Triggered by stress/tension  . OSA (obstructive sleep apnea)     Patient Active Problem List   Diagnosis Date Noted  . Bilateral groin pain 11/30/2013  . Lumbago 11/30/2013  . Urinary frequency 11/30/2013  . Lower urinary tract symptoms (LUTS) 11/30/2013  . Right-sided low back pain without sciatica 11/23/2013  . Achilles tendinitis of right lower extremity 09/23/2013  . Hyperglycemia 08/12/2013  . OSA (obstructive sleep apnea) 08/12/2013  . Morbid obesity ([place]) 08/12/2013  . Hypertension   . Left ventricular hypertrophy   . High cholesterol     Past Surgical History:  Procedure Laterality Date  . KNEE  ARTHROPLASTY Left    left 2013 ligaments        Home Medications    Prior to Admission medications   Medication Sig Start Date End Date Taking? Authorizing Provider  hydrochlorothiazide (HYDRODIURIL) 25 MG tablet TAKE 1 TABLET (25 MG TOTAL) BY MOUTH DAILY. 05/07/14  Yes Burnis Medin, MD  HYDROcodone-acetaminophen (NORCO) 5-325 MG tablet Take 1-2 tablets by mouth every 6 (six) hours as needed (for pain). 03/31/16  Yes John Molpus, MD  ibuprofen (ADVIL,MOTRIN) 800 MG tablet Take 800 mg by mouth every 8 (eight) hours as needed.   Yes Historical Provider, MD  gatifloxacin (ZYMAXID) 0.5 % SOLN Place 1 drop into the left eye 4 (four) times daily. 03/31/16   Gareth Morgan, MD  hydrocortisone (ANUSOL-HC) 25 MG suppository  02/17/14   Historical Provider, MD  lisinopril (PRINIVIL,ZESTRIL) 10 MG tablet TAKE 1 TABLET (10 MG TOTAL) BY MOUTH DAILY. 05/07/14   Burnis Medin, MD    Family History Family History  Problem Relation Age of Onset  . Hypertension Mother   . Other Father     murdered   . Breast cancer Maternal Grandmother   . Breast cancer Maternal Aunt     x 3  . Colon cancer Neg Hx     Social History Social History  Substance Use Topics  . Smoking status: Current Every Day Smoker    Packs/day:  0.25    Years: 30.00    Types: Cigarettes  . Smokeless tobacco: Never Used     Comment: tobacco info given 02/24/14  . Alcohol use 0.0 oz/week     Comment: rarely     Allergies   Penicillins   Review of Systems Review of Systems  Constitutional: Negative for fever.  Eyes: Positive for pain (Left), discharge, redness and visual disturbance (Left eye, blurry).  Neurological: Negative for headaches.     Physical Exam Updated Vital Signs BP 119/83   Pulse 88   Temp 98.9 F (37.2 C) (Oral)   Resp 20   Ht 5\' 8"  (1.727 m)   Wt 293 lb (132.9 kg)   SpO2 96%   BMI 44.55 kg/m   Physical Exam  Constitutional: He appears well-developed and well-nourished. No distress.  HENT:   Head: Normocephalic and atraumatic.  Eyes: EOM are normal. Pupils are equal, round, and reactive to light. Right eye exhibits no discharge. Left eye exhibits no discharge. Left conjunctiva is injected. Right pupil is round and reactive. Left pupil is round and reactive. Pupils are equal.  Eye exam: Right eye ocular pressure is 15-16. Left eye occular pressure is 13-14. Rust ring noted at 9 o'clock position.    Fluorescein shows uptake around rust ring Slit lamp shows possible linear corneal superficial abrasions/abnormalities however no uptake Neg seidels  Lid everted no FB  Neck: Neck supple.  Cardiovascular: Normal rate.   Pulmonary/Chest: Effort normal.  Abdominal: Soft.  Musculoskeletal: Normal range of motion.  Neurological: He is alert.  Skin: Skin is warm and dry.  Psychiatric: He has a normal mood and affect.  Nursing note and vitals reviewed.    ED Treatments / Results  DIAGNOSTIC STUDIES: Oxygen Saturation is 96% on RA, normal by my interpretation.    COORDINATION OF CARE: 6:24 PM- Pt advised of plan for treatment and pt agrees.  Labs (all labs ordered are listed, but only abnormal results are displayed) Labs Reviewed - No data to display  EKG  EKG Interpretation None       Radiology No results found.  Procedures Procedures (including critical care time)  Medications Ordered in ED Medications  tetracaine (PONTOCAINE) 0.5 % ophthalmic solution 2 drop (2 drops Left Eye Given by Other 03/31/16 1850)  fluorescein ophthalmic strip 1 strip (1 strip Left Eye Given 03/31/16 1850)     Initial Impression / Assessment and Plan / ED Course  I have reviewed the triage vital signs and the nursing notes.  Pertinent labs & imaging results that were available during my care of the patient were reviewed by me and considered in my medical decision making (see chart for details).    46yo male presents with concern for metallic foreign body yesterday after welding with  removal yesterday, rust ring in place, who presents with continuing pain. Reports blurred vision however on my exam he is 20/20 -1. No sign of open globe. CT negative. Pressures normal.  Pt with rust ring on exam, do not see signs of continuing foreign body. Called Dr. Valetta Close to discuss pt. Recommends transfer to [facility] for evaluation.    Final Clinical Impressions(s) / ED Diagnoses   Final diagnoses:  Abrasion of left cornea, initial encounter  Corneal rust ring of left eye    New Prescriptions Discharge Medication List as of 03/31/2016  8:11 PM    START taking these medications   Details  gatifloxacin (ZYMAXID) 0.5 % SOLN Place 1 drop into the left  eye 4 (four) times daily., Starting Sat 03/31/2016, Print       I personally performed the services described in this documentation, which was scribed in my presence. The recorded information has been reviewed and is accurate.     Gareth Morgan, MD 04/03/16 1243

## 2016-03-31 NOTE — ED Triage Notes (Addendum)
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

## 2016-03-31 NOTE — Consult Note (Signed)
OPHTHALMOLOGY CONSULT NOTE  Date: 2/24/18Time: 9:56 PM  Patient Name: Justin [name]  DOB: 01-Aug-1970 MRN: [id number]  Reason for Consult: Eye injury  HPI:  Patient states that yesterday he was grinding metal and welding and developed eye pain afterwards. There was no know direct injury to the eye from [place] object. Eye pain has gradually worsened over time. Pt was seen at [place] ED and I recommended evaluation here at [facility] ED.   Prior to Admission medications   Medication Sig Start Date End Date Taking? Authorizing Provider  hydrochlorothiazide (HYDRODIURIL) 25 MG tablet TAKE 1 TABLET (25 MG TOTAL) BY MOUTH DAILY. 05/07/14  Yes Burnis Medin, MD  HYDROcodone-acetaminophen (NORCO) 5-325 MG tablet Take 1-2 tablets by mouth every 6 (six) hours as needed (for pain). 03/31/16  Yes John Molpus, MD  ibuprofen (ADVIL,MOTRIN) 800 MG tablet Take 800 mg by mouth every 8 (eight) hours as needed.   Yes Historical Provider, MD  gatifloxacin (ZYMAXID) 0.5 % SOLN Place 1 drop into the left eye 4 (four) times daily. 03/31/16   Gareth Morgan, MD  hydrocortisone (ANUSOL-HC) 25 MG suppository  02/17/14   Historical Provider, MD  lisinopril (PRINIVIL,ZESTRIL) 10 MG tablet TAKE 1 TABLET (10 MG TOTAL) BY MOUTH DAILY. 05/07/14   Burnis Medin, MD    Past Medical History:  Diagnosis Date  . Arrhythmia   . Arthritis    Both knees  . Chicken pox    As a child  . High cholesterol   . Hypertension    on meds since 1998   . Left ventricular hypertrophy    card work up [facility] ? inc QRS  . Migraines    Triggered by stress/tension  . OSA (obstructive sleep apnea)     family history includes Breast cancer in his maternal aunt and maternal grandmother; Hypertension in his mother; Other in his father.  Social History   Occupational History  . [id number] [employer]    maintenance tech   Social History Main Topics  . Smoking status: Current Every Day Smoker    Packs/day: 0.25   Years: 30.00    Types: Cigarettes  . Smokeless tobacco: Never Used     Comment: tobacco info given 02/24/14  . Alcohol use 0.0 oz/week     Comment: rarely  . Drug use: No  . Sexual activity: Not on file    Allergies  Allergen Reactions  . Penicillins Anaphylaxis    Pt can tolerate amoxicillin    ROS: Positive as above, otherwise negative.  EXAM:  Mental Status: A&O x 3  Base Exam: Right Eye Left Eye  Visual Acuity (At near) 20/25 20/25  IOP (Tonopen) 13 8  Pupillary Exam No RAPD  No RAPD  Motility Full  Full   Confrontation VF Full  Full    Anterior Segment Exam    Lids/Lashes WNL Mild lid edema  Conjuctiva White and Quiet 2+ injection  Cornea Clear 0.5 mm rust ring with surrounding epithelial defect. Rust ring removed with blade without complication. Patient sidel neg before and after.   Anterior Chamber Deep and Quiet Deep and Quiet  Iris Round, Reactive Round, Reactive  Lens Clear Clear  Vitreous WNL WNL   Poster Segment Exam    Disc Good RR Good RR  CD ratio    Macula    Vessels    Periphery     Radiographic Studies Reviewed: None Ct Orbits Wo Contrast  Result Date: 03/31/2016 CLINICAL DATA:  Returning for  worsening left eye pain Was eval in ED for same yesterday and had a piece of metal removed from OS. States feels Like something is in eye and has swelling to lids todayHX HTN EXAM: CT ORBITS WITHOUT CONTRAST TECHNIQUE: Multidetector CT images were obtained using the standard protocol without intravenous contrast. COMPARISON:  None. FINDINGS: Orbits: No or ocular or orbital radiopaque foreign body. Both globes are normal in size and shape. Both ocular lenses are normally positioned. No orbital mass, abscess or inflammation. On the left, there is a chronic appearing orbital floor fracture. Some fat protrudes inferiorly through the fracture. There is no ocular muscle entrapment. Visualized sinuses: Small left and moderate right maxillary sinus mucous retention cysts.  Sinuses are otherwise clear. Clear mastoid air cells and middle ear cavities. Soft tissues: No soft tissue masses.  No adenopathy. Limited intracranial: No significant or unexpected finding. IMPRESSION: 1. No evidence of a radiopaque foreign body. 2. No CT evidence of orbital inflammation or an abscess. 3. Old left orbital floor fracture. Electronically Signed   By: Lajean Manes M.D.   On: 03/31/2016 20:29    Assessment and Recommendation: 1. Corneal rust ring:  Removed without complication.  Emycin tid   Gatifloxacin Q3H  Fu Monday as scheduled  Call if worsening symptoms  Please call with any questions.  Jola Schmidt MD [facility] Ophthalmology [phone]

## 2016-03-31 NOTE — ED Notes (Signed)
ED Provider at bedside. 

## 2016-03-31 NOTE — Telephone Encounter (Signed)
Denies questions or needs at this time (via phone)

## 2016-03-31 NOTE — ED Provider Notes (Signed)
[place] DE[facility] Provider Note: Georgena Spurling, MD, FACEP  CSN: [id number] MRN: [id number] ARRIVAL: 03/30/16 at 2349 ROOM: [place]  Justin [name] is a 46 y.o. 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 He developed the gradual onset of a foreign body sensation in his left medial cornea. The pain is become moderate to severe. There is some mild associated photophobia, conjunctival injection and increased tearing. Pain is somewhat worse with exposure to light.   Past Medical History:  Diagnosis Date  . Arrhythmia   . Arthritis    Both knees  . Chicken pox    As a child  . High cholesterol   . Hypertension    on meds since 1998   . Left ventricular hypertrophy    card work up [facility] ? inc QRS  . Migraines    Triggered by stress/tension  . OSA (obstructive sleep apnea)     Past Surgical History:  Procedure Laterality Date  . KNEE ARTHROPLASTY Left    left 2013 ligaments     Family History  Problem Relation Age of Onset  . Hypertension Mother   . Other Father     murdered   . Breast cancer Maternal Grandmother   . Breast cancer Maternal Aunt     x 3  . Colon cancer Neg Hx     Social History  Substance Use Topics  . Smoking status: Current Every Day Smoker    Packs/day: 0.25    Years: 30.00    Types: Cigarettes  . Smokeless tobacco: Never Used     Comment: tobacco info given 02/24/14  . Alcohol use 0.0 oz/week     Comment: rarely    Prior to Admission medications   Medication Sig Start Date End Date Taking? Authorizing Provider  hydrochlorothiazide (HYDRODIURIL) 25 MG tablet TAKE 1 TABLET (25 MG TOTAL) BY MOUTH DAILY. 05/07/14   Burnis Medin, MD  hydrocortisone (ANUSOL-HC) 25 MG suppository  02/17/14   Historical Provider, MD  ibuprofen (ADVIL,MOTRIN) 800 MG tablet Take 800 mg by mouth every 8 (eight) hours as needed.    Historical Provider, MD  lisinopril (PRINIVIL,ZESTRIL) 10 MG tablet  TAKE 1 TABLET (10 MG TOTAL) BY MOUTH DAILY. 05/07/14   Burnis Medin, MD    Allergies Penicillins   REVIEW OF SYSTEMS  Negative except as noted here or in the History of Present Illness.   PHYSICAL EXAMINATION  Initial Vital Signs Blood pressure 128/84, pulse 82, temperature 98.1 F (36.7 C), temperature source Oral, resp. rate 20, height 5\' 8"  (1.727 m), weight 295 lb (133.8 kg), SpO2 95 %.  Examination General: Well-developed, well-nourished male in no acute distress; appearance consistent with age of record HENT: normocephalic; atraumatic Eyes: pupils equal, round and reactive to light; extraocular muscles intact; left conjunctival injection; increased tearing left eye; foreign body consistent with metallic particles seen in medial left cornea; no fluorescein uptake Neck: supple Heart: regular rate and rhythm Lungs: Normal respiratory effort and excursion Abdomen: soft; nondistended Extremities: No deformity; full range of motion Neurologic: Awake, alert and oriented; motor function intact in all extremities and symmetric; no facial droop Skin: Warm and dry Psychiatric: Normal mood and affect   RESULTS  Summary of this visit's results, reviewed by myself:   EKG Interpretation  Date/Time:    Ventricular Rate:    PR Interval:    QRS Duration:   QT Interval:    QTC Calculation:  R Axis:     Text Interpretation:        Laboratory Studies: No results found for this or any previous visit (from the past 24 hour(s)). Imaging Studies: No results found.  ED COURSE  Nursing notes and initial vitals signs, including pulse oximetry, reviewed.  Vitals:   03/31/16 0003  BP: 128/84  Pulse: 82  Resp: 20  Temp: 98.1 F (36.7 C)  TempSrc: Oral  SpO2: 95%  Weight: 295 lb (133.8 kg)  Height: 5\' 8"  (1.727 m)    PROCEDURES   FOREIGN BODY REMOVAL The patient's left eye was anesthetized with 2 drops of tetracaine solution. The metallic foreign object was then removed  from the cornea using an 18-gauge needle. On slit-lamp examination post removal of rust ring was seen. We will have him follow-up with ophthalmology for further treatment. The patient tolerated this well and there were no immediate complications.  ED DIAGNOSES     ICD-9-CM ICD-10-CM   1. Foreign body of left cornea, initial encounter 930.0 T15.02XA    [id number]    2. Corneal rust ring of left eye 371.89 H18.892    908.5         Shanon Rosser, MD 03/31/16 [phone]

## 2016-03-31 NOTE — ED Triage Notes (Addendum)
Returning for worsening left eye pain Was eval in ED for same yesterday and had a piece of metal removed from OS. States feels  Like something is in eye and has swelling to lids today. Has taken 1 dose of Vicodin and use ointment prescribed

## 2016-04-01 NOTE — ED Notes (Signed)
Pt walked to room by dr Valetta Close, treated and discharged by dr Valetta Close. Pt walked to the lobby by dr Valetta Close.

## 2016-04-01 NOTE — ED Provider Notes (Signed)
Patient seen at [facility] and sent to [facility] to be seen by ophthalmology.  Dr. Valetta Close from ophthalmology at bedside.  Discharged by ophthalmology.  I did not see or evaluate this patient.  Please see EDP note from [facility].   Montine Circle, PA-C 04/01/16 [place], MD 04/02/16 [phone]

## 2016-05-24 ENCOUNTER — Encounter (HOSPITAL_BASED_OUTPATIENT_CLINIC_OR_DEPARTMENT_OTHER): Payer: Self-pay | Admitting: *Deleted

## 2016-05-24 ENCOUNTER — Emergency Department (HOSPITAL_BASED_OUTPATIENT_CLINIC_OR_DEPARTMENT_OTHER)
Admission: EM | Admit: 2016-05-24 | Discharge: 2016-05-25 | Disposition: A | Discharge status: Home / Self Care | Payer: BLUE CROSS/BLUE SHIELD | Attending: Emergency Medicine | Admitting: Emergency Medicine

## 2016-05-24 DIAGNOSIS — Z79899 Other long term (current) drug therapy: Secondary | ICD-10-CM | POA: Insufficient documentation

## 2016-05-24 DIAGNOSIS — H1131 Conjunctival hemorrhage, right eye: Secondary | ICD-10-CM | POA: Insufficient documentation

## 2016-05-24 DIAGNOSIS — W228XXA Striking against or struck by other objects, initial encounter: Secondary | ICD-10-CM | POA: Insufficient documentation

## 2016-05-24 DIAGNOSIS — Y939 Activity, unspecified: Secondary | ICD-10-CM | POA: Insufficient documentation

## 2016-05-24 DIAGNOSIS — I10 Essential (primary) hypertension: Secondary | ICD-10-CM | POA: Insufficient documentation

## 2016-05-24 DIAGNOSIS — Y929 Unspecified place or not applicable: Secondary | ICD-10-CM | POA: Insufficient documentation

## 2016-05-24 DIAGNOSIS — F17210 Nicotine dependence, cigarettes, uncomplicated: Secondary | ICD-10-CM | POA: Insufficient documentation

## 2016-05-24 DIAGNOSIS — Y999 Unspecified external cause status: Secondary | ICD-10-CM | POA: Insufficient documentation

## 2016-05-24 MED ORDER — FLUORESCEIN SODIUM 0.6 MG OP STRP
1.0000 | ORAL_STRIP | Freq: Once | OPHTHALMIC | Status: AC
  Administered 2016-05-25: 1 via OPHTHALMIC
  Filled 2016-05-24: qty 1

## 2016-05-24 MED ORDER — TETRACAINE HCL 0.5 % OP SOLN
1.0000 [drp] | Freq: Once | OPHTHALMIC | Status: AC
  Administered 2016-05-25: 1 [drp] via OPHTHALMIC
  Filled 2016-05-24: qty 4

## 2016-05-24 NOTE — ED Triage Notes (Signed)
He hit himself in the right eye with a hammer yesterday. He woke with blood in his eye this am. Slight blurred vision.

## 2016-05-25 NOTE — ED Provider Notes (Signed)
[facility] Provider Note: Georgena Spurling, MD, FACEP  CSN: [id number] MRN: [id number] ARRIVAL: 05/24/16 at 2136 ROOM: [place]  Justin [name] is a 46 y.o. male who accidentally hit himself in the right eye with a mallet 2 days ago. He is here with redness of the right eye with mild pain which she rates as one out of 10 and describes as an ache and slightly blurred vision. His visual acuity is 20/70 bilaterally but he is not wearing his corrective lenses. Pain is somewhat worse with palpation of the periorbital area.   Past Medical History:  Diagnosis Date  . Arrhythmia   . Arthritis    Both knees  . Chicken pox    As a child  . High cholesterol   . Hypertension    on meds since 1998   . Left ventricular hypertrophy    card work up [facility] ? inc QRS  . Migraines    Triggered by stress/tension  . OSA (obstructive sleep apnea)     Past Surgical History:  Procedure Laterality Date  . KNEE ARTHROPLASTY Left    left 2013 ligaments     Family History  Problem Relation Age of Onset  . Hypertension Mother   . Other Father     murdered   . Breast cancer Maternal Grandmother   . Breast cancer Maternal Aunt     x 3  . Colon cancer Neg Hx     Social History  Substance Use Topics  . Smoking status: Current Every Day Smoker    Packs/day: 0.25    Years: 30.00    Types: Cigarettes  . Smokeless tobacco: Never Used     Comment: tobacco info given 02/24/14  . Alcohol use 0.0 oz/week     Comment: rarely    Prior to Admission medications   Medication Sig Start Date End Date Taking? Authorizing Provider  gatifloxacin (ZYMAXID) 0.5 % SOLN Place 1 drop into the left eye 4 (four) times daily. 03/31/16   Gareth Morgan, MD  hydrochlorothiazide (HYDRODIURIL) 25 MG tablet TAKE 1 TABLET (25 MG TOTAL) BY MOUTH DAILY. 05/07/14   Burnis Medin, MD  HYDROcodone-acetaminophen (NORCO) 5-325 MG tablet Take 1-2  tablets by mouth every 6 (six) hours as needed (for pain). 03/31/16   Shanon Rosser, MD  hydrocortisone (ANUSOL-HC) 25 MG suppository  02/17/14   Historical Provider, MD  ibuprofen (ADVIL,MOTRIN) 800 MG tablet Take 800 mg by mouth every 8 (eight) hours as needed.    Historical Provider, MD  lisinopril (PRINIVIL,ZESTRIL) 10 MG tablet TAKE 1 TABLET (10 MG TOTAL) BY MOUTH DAILY. 05/07/14   Burnis Medin, MD    Allergies Penicillins   REVIEW OF SYSTEMS  Negative except as noted here or in the History of Present Illness.   PHYSICAL EXAMINATION  Initial Vital Signs Blood pressure 121/82, pulse 84, temperature 98.6 F (37 C), temperature source Oral, resp. rate 18, height 5\' 8"  (1.727 m), weight 293 lb (132.9 kg), SpO2 97 %.  Examination General: Well-developed, well-nourished male in no acute distress; appearance consistent with age of record HENT: normocephalic; atraumatic Eyes: pupils equal, round and reactive to light; extraocular muscles intact; right subconjunctival hemorrhage with mild chemosis; no hyphema; no corneal abrasion seen on fluorescein exam Neck: supple Heart: regular rate and rhythm Lungs: clear to auscultation bilaterally Abdomen: soft; nondistended; nontender; bowel sounds present Extremities: No deformity; full range of motion Neurologic: Awake,  alert and oriented; motor function intact in all extremities and symmetric; no facial droop Skin: Warm and dry Psychiatric: Normal mood and affect   RESULTS  Summary of this visit's results, reviewed by myself:   EKG Interpretation  Date/Time:    Ventricular Rate:    PR Interval:    QRS Duration:   QT Interval:    QTC Calculation:   R Axis:     Text Interpretation:        Laboratory Studies: No results found for this or any previous visit (from the past 24 hour(s)). Imaging Studies: No results found.  ED COURSE  Nursing notes and initial vitals signs, including pulse oximetry, reviewed.  Vitals:   05/24/16  2148 05/24/16 2150 05/25/16 0203  BP:  127/85 121/82  Pulse:  88 84  Resp:  20 18  Temp:  98.6 F (37 C)   TempSrc:  Oral   SpO2:  100% 97%  Weight: 293 lb (132.9 kg)    Height: 5\' 8"  (1.727 m)     He was advised to use artificial tears as needed for eye dryness or discomfort. We will follow-up with his ophthalmologist.  PROCEDURES    ED DIAGNOSES     ICD-9-CM ICD-10-CM   1. Traumatic subconjunctival hemorrhage of right eye 372.72 H11.31        Shanon Rosser, MD 05/25/16 [phone]

## 2016-09-30 ENCOUNTER — Encounter (HOSPITAL_COMMUNITY): Payer: Self-pay | Admitting: Nurse Practitioner

## 2016-09-30 ENCOUNTER — Inpatient Hospital Stay (HOSPITAL_COMMUNITY): Payer: Self-pay

## 2016-09-30 ENCOUNTER — Emergency Department (HOSPITAL_COMMUNITY): Payer: Self-pay

## 2016-09-30 ENCOUNTER — Inpatient Hospital Stay (HOSPITAL_COMMUNITY)
Admission: EM | Admit: 2016-09-30 | Discharge: 2016-10-02 | DRG: 880 | Disposition: A | Discharge status: Home / Self Care | Payer: Self-pay | Attending: Neurology | Admitting: Neurology

## 2016-09-30 DIAGNOSIS — I674 Hypertensive encephalopathy: Secondary | ICD-10-CM | POA: Diagnosis present

## 2016-09-30 DIAGNOSIS — R29810 Facial weakness: Secondary | ICD-10-CM | POA: Diagnosis present

## 2016-09-30 DIAGNOSIS — I10 Essential (primary) hypertension: Secondary | ICD-10-CM | POA: Diagnosis present

## 2016-09-30 DIAGNOSIS — T405X1A Poisoning by cocaine, accidental (unintentional), initial encounter: Secondary | ICD-10-CM | POA: Diagnosis present

## 2016-09-30 DIAGNOSIS — E785 Hyperlipidemia, unspecified: Secondary | ICD-10-CM | POA: Diagnosis present

## 2016-09-30 DIAGNOSIS — M170 Bilateral primary osteoarthritis of knee: Secondary | ICD-10-CM | POA: Diagnosis present

## 2016-09-30 DIAGNOSIS — Z6841 Body Mass Index (BMI) 40.0 and over, adult: Secondary | ICD-10-CM

## 2016-09-30 DIAGNOSIS — F419 Anxiety disorder, unspecified: Principal | ICD-10-CM | POA: Diagnosis present

## 2016-09-30 DIAGNOSIS — Z880 Allergy status to penicillin: Secondary | ICD-10-CM

## 2016-09-30 DIAGNOSIS — G4733 Obstructive sleep apnea (adult) (pediatric): Secondary | ICD-10-CM | POA: Diagnosis present

## 2016-09-30 DIAGNOSIS — R200 Anesthesia of skin: Secondary | ICD-10-CM | POA: Diagnosis present

## 2016-09-30 DIAGNOSIS — Z96652 Presence of left artificial knee joint: Secondary | ICD-10-CM | POA: Diagnosis present

## 2016-09-30 DIAGNOSIS — G43909 Migraine, unspecified, not intractable, without status migrainosus: Secondary | ICD-10-CM | POA: Diagnosis present

## 2016-09-30 DIAGNOSIS — N390 Urinary tract infection, site not specified: Secondary | ICD-10-CM | POA: Diagnosis present

## 2016-09-30 DIAGNOSIS — G43109 Migraine with aura, not intractable, without status migrainosus: Secondary | ICD-10-CM | POA: Diagnosis present

## 2016-09-30 DIAGNOSIS — I639 Cerebral infarction, unspecified: Secondary | ICD-10-CM

## 2016-09-30 DIAGNOSIS — F1410 Cocaine abuse, uncomplicated: Secondary | ICD-10-CM | POA: Diagnosis present

## 2016-09-30 DIAGNOSIS — F17210 Nicotine dependence, cigarettes, uncomplicated: Secondary | ICD-10-CM | POA: Diagnosis present

## 2016-09-30 DIAGNOSIS — H532 Diplopia: Secondary | ICD-10-CM | POA: Diagnosis present

## 2016-09-30 LAB — CBC
HCT: 45.6 % (ref 39.0–52.0)
Hemoglobin: 15.8 g/dL (ref 13.0–17.0)
MCH: 29.4 pg (ref 26.0–34.0)
MCHC: 34.6 g/dL (ref 30.0–36.0)
MCV: 84.9 fL (ref 78.0–100.0)
Platelets: 268 10*3/uL (ref 150–400)
RBC: 5.37 MIL/uL (ref 4.22–5.81)
RDW: 14.7 % (ref 11.5–15.5)
WBC: 13.3 10*3/uL — ABNORMAL HIGH (ref 4.0–10.5)

## 2016-09-30 LAB — COMPREHENSIVE METABOLIC PANEL
ALT: 23 U/L (ref 17–63)
AST: 24 U/L (ref 15–41)
Albumin: 4.2 g/dL (ref 3.5–5.0)
Alkaline Phosphatase: 57 U/L (ref 38–126)
Anion gap: 8 (ref 5–15)
BUN: 19 mg/dL (ref 6–20)
CO2: 23 mmol/L (ref 22–32)
Calcium: 9.1 mg/dL (ref 8.9–10.3)
Chloride: 108 mmol/L (ref 101–111)
Creatinine, Ser: 1.19 mg/dL (ref 0.61–1.24)
GFR calc Af Amer: >60 mL/min (ref >60)
GFR calc non Af Amer: >60 mL/min (ref >60)
Glucose, Bld: 99 mg/dL (ref 65–99)
Potassium: 3.8 mmol/L (ref 3.5–5.1)
Sodium: 139 mmol/L (ref 135–145)
Total Bilirubin: 0.5 mg/dL (ref 0.3–1.2)
Total Protein: 7.7 g/dL (ref 6.5–8.1)

## 2016-09-30 LAB — I-STAT TROPONIN, ED: Troponin i, poc: 0 ng/mL (ref 0.00–0.08)

## 2016-09-30 LAB — DIFFERENTIAL
Basophils Absolute: 0 10*3/uL (ref 0.0–0.1)
Basophils Relative: 0 %
Eosinophils Absolute: 0.2 10*3/uL (ref 0.0–0.7)
Eosinophils Relative: 1 %
Lymphocytes Relative: 32 %
Lymphs Abs: 4.2 10*3/uL — ABNORMAL HIGH (ref 0.7–4.0)
Monocytes Absolute: 1.4 10*3/uL — ABNORMAL HIGH (ref 0.1–1.0)
Monocytes Relative: 11 %
Neutro Abs: 7.5 10*3/uL (ref 1.7–7.7)
Neutrophils Relative %: 56 %

## 2016-09-30 LAB — CBG MONITORING, ED: Glucose-Capillary: 91 mg/dL (ref 65–99)

## 2016-09-30 LAB — I-STAT CHEM 8, ED
BUN: 21 mg/dL — ABNORMAL HIGH (ref 6–20)
Calcium, Ion: 1.14 mmol/L — ABNORMAL LOW (ref 1.15–1.40)
Chloride: 105 mmol/L (ref 101–111)
Creatinine, Ser: 1.2 mg/dL (ref 0.61–1.24)
Glucose, Bld: 98 mg/dL (ref 65–99)
HCT: 47 % (ref 39.0–52.0)
Hemoglobin: 16 g/dL (ref 13.0–17.0)
Potassium: 3.9 mmol/L (ref 3.5–5.1)
Sodium: 142 mmol/L (ref 135–145)
TCO2: 28 mmol/L (ref 22–32)

## 2016-09-30 LAB — MRSA PCR SCREENING: MRSA by PCR: NEGATIVE

## 2016-09-30 LAB — PROTIME-INR
INR: 1.05
Prothrombin Time: 13.7 seconds (ref 11.4–15.2)

## 2016-09-30 LAB — APTT: aPTT: 39 seconds — ABNORMAL HIGH (ref 24–36)

## 2016-09-30 LAB — ETHANOL: Alcohol, Ethyl (B): <5 mg/dL (ref <5)

## 2016-09-30 MED ORDER — LABETALOL HCL 5 MG/ML IV SOLN: 20.0000 mg | Freq: Once | INTRAVENOUS | Status: DC

## 2016-09-30 MED ORDER — ACETAMINOPHEN 160 MG/5ML PO SOLN: 650.0000 mg | ORAL | Status: DC | PRN

## 2016-09-30 MED ORDER — IOPAMIDOL (ISOVUE-370) INJECTION 76%
INTRAVENOUS | Status: AC
  Filled 2016-09-30: qty 50

## 2016-09-30 MED ORDER — ALTEPLASE (STROKE) FULL DOSE INFUSION
90.0000 mg | Freq: Once | INTRAVENOUS | Status: AC
  Administered 2016-09-30: 90 mg via INTRAVENOUS
  Filled 2016-09-30: qty 100

## 2016-09-30 MED ORDER — SODIUM CHLORIDE 0.9 % IV SOLN
INTRAVENOUS | Status: DC
  Administered 2016-09-30 – 2016-10-01 (×2): via INTRAVENOUS

## 2016-09-30 MED ORDER — SENNOSIDES-DOCUSATE SODIUM 8.6-50 MG PO TABS: 1.0000 | ORAL_TABLET | Freq: Every evening | ORAL | Status: DC | PRN

## 2016-09-30 MED ORDER — STROKE: EARLY STAGES OF RECOVERY BOOK
Freq: Once | Status: DC
  Filled 2016-09-30: qty 1

## 2016-09-30 MED ORDER — IOPAMIDOL (ISOVUE-370) INJECTION 76%
INTRAVENOUS | Status: AC
  Administered 2016-09-30: 90 mL
  Filled 2016-09-30: qty 50

## 2016-09-30 MED ORDER — ACETAMINOPHEN 325 MG PO TABS: 650.0000 mg | ORAL_TABLET | ORAL | Status: DC | PRN

## 2016-09-30 MED ORDER — ACETAMINOPHEN 650 MG RE SUPP: 650.0000 mg | RECTAL | Status: DC | PRN

## 2016-09-30 MED ORDER — NICARDIPINE HCL IN NACL 20-0.86 MG/200ML-% IV SOLN: 0.0000 mg/h | INTRAVENOUS | Status: DC

## 2016-09-30 NOTE — ED Notes (Signed)
Attempted report x1. 

## 2016-09-30 NOTE — H&P (Signed)
Stroke neurology admission H&P  CC: Headache, left arm and leg weakness, left facial droop, double vision  History is obtained from: Patient and his wife at bedside  HPI: Zebulin Siegel is a 46 y.o. male past medical history of hypertension, hyperlipidemia, migraines, obstructive sleep apnea, cocaine abuse, who was in his usual state of health until about 2:15 PM, when he started noticing some left-sided tingling and numbness. He was on his way from [facility] to [place], stopped at a truck stop and noticed that his left arm and leg feel weak as well. He looked at himself in the mirror and saw left facial droop. He came in to [facility] where he was evaluated and a code stroke was activated. The patient was sent to [facility] emergency room as a code stroke. He complained of double vision, left arm weakness, left leg weakness and left-sided tingling and numbness at the time of arrival when he was evaluated. On my initial evaluation, his NIH stroke scale was 2 (see below) He denied using any cocaine today but endorses using cocaine on Friday. Denied using any alcohol. Denied any preceding illnesses. Next line denies fevers or chills. Denies nausea or vomiting at this time. Denied abdominal pain. Denied easy bleeding or bruising. Denied any recent surgeries. Denied any blood thinning medications currently he is on. He has had a recent loss of a family member because of which she has been very upset. His exam was not perfectly consistent, but given the laterality of symptoms as well as eye findings as documented below, I obtained a CT a and perfusion studies and then decided to give him IV TPA. He got the bolus at 1802 hours.  LKW: 2:15 PM tpa given?: Yes Premorbid modified Rankin scale (mRS):  0 Delays in the process: -Transfer from [facility] -Inconsistent exam. -Obtaining CTA and perfusion   ROS: A 14 point ROS was performed and is negative except as noted in the HPI.     Past Medical History:  Diagnosis Date  . Arrhythmia   . Arthritis    Both knees  . Chicken pox    As a child  . High cholesterol   . Hypertension    on meds since 1998   . Left ventricular hypertrophy    card work up [facility] ? inc QRS  . Migraines    Triggered by stress/tension  . OSA (obstructive sleep apnea)      Family History  Problem Relation Age of Onset  . Hypertension Mother   . Other Father        murdered   . Breast cancer Maternal Grandmother   . Breast cancer Maternal Aunt        x 3  . Colon cancer Neg Hx     Social History:   reports that he has been smoking Cigarettes.  He has a 7.50 pack-year smoking history. He has never used smokeless tobacco. He reports that he drinks alcohol. He reports that he does not use drugs.  Medications  Current Facility-Administered Medications:  .  alteplase (ACTIVASE) 1 mg/mL infusion 90 mg, 90 mg, Intravenous, Once, Amie Portland, MD, Last Rate: 90 mL/hr at 09/30/16 1802, 90 mg at 09/30/16 1802 .  iopamidol (ISOVUE-370) 76 % injection, , , ,   Current Outpatient Prescriptions:  .  gatifloxacin (ZYMAXID) 0.5 % SOLN, Place 1 drop into the left eye 4 (four) times daily., Disp: 1 Bottle, Rfl: 0 .  hydrochlorothiazide (HYDRODIURIL) 25 MG tablet, TAKE 1 TABLET (  25 MG TOTAL) BY MOUTH DAILY., Disp: 30 tablet, Rfl: 5 .  HYDROcodone-acetaminophen (NORCO) 5-325 MG tablet, Take 1-2 tablets by mouth every 6 (six) hours as needed (for pain)., Disp: 10 tablet, Rfl: 0 .  hydrocortisone (ANUSOL-HC) 25 MG suppository, , Disp: , Rfl: 0 .  ibuprofen (ADVIL,MOTRIN) 800 MG tablet, Take 800 mg by mouth every 8 (eight) hours as needed., Disp: , Rfl:  .  lisinopril (PRINIVIL,ZESTRIL) 10 MG tablet, TAKE 1 TABLET (10 MG TOTAL) BY MOUTH DAILY., Disp: 30 tablet, Rfl: 5   Exam: Current vital signs: BP 139/87   Pulse 61   Temp 98.1 F (36.7 C)   Resp 18   Wt 123.1 kg (271 lb 6.2 oz)   SpO2 99%   BMI 41.26 kg/m   Vital signs in last 24  hours: Temp:  [98.1 F (36.7 C)] 98.1 F (36.7 C) (08/26 1800) Pulse Rate:  [61-75] 61 (08/26 1800) Resp:  [11-21] 18 (08/26 1800) BP: (136-154)/(87-105) 139/87 (08/26 1800) SpO2:  [92 %-99 %] 99 % (08/26 1800) Weight:  [123.1 kg (271 lb 6.2 oz)] 123.1 kg (271 lb 6.2 oz) (08/26 1700)  GENERAL: Awake, alert in NAD HEENT: - Normocephalic and atraumatic, dry mm, no LN++, no Thyromegally LUNGS - Clear to auscultation bilaterally with no wheezes CV - S1S2 RRR, no m/r/g, equal pulses bilaterally. ABDOMEN - Soft, nontender, nondistended with normoactive BS Ext: warm, well perfused, intact peripheral pulses, 1+ edema  NEURO:  Mental Status: AA&Ox3  Language: speech is clear.  Naming, repetition, fluency, and comprehension intact. Cranial Nerves: PERRL 71mm/brisk. Extraocular movements show slightly disconjugate gaze and bilateral end gaze nystagmus,, visual fields full, no facial asymmetry  facial sensation decreased on the left, hearing intact, tongue/uvula/soft palate midline, normal sternocleidomastoid and trapezius muscle strength. No evidence of tongue atrophy or fibrillations Motor: 4+/5 left upper extremity, 4+/5 left lower extremity-no drift on the left. 5/5 right upper and lower extremity with no drift.  Tone: is normal and bulk is normal Sensation- decreased to light touch and temperature on the left hemibody. Coordination: FTN intact bilaterally, no ataxia in BLE. Gait- deferred  NIHSS 1a Level of Conscious.: 0 1b LOC Questions: 0 1c LOC Commands: 0 2 Best Gaze: 1 3 Visual: 0 4 Facial Palsy: 0 5a Motor Arm - left: 0 5b Motor Arm - Right: 0 6a Motor Leg - Left: 0 6b Motor Leg - Right: 0 7 Limb Ataxia: 0 8 Sensory: 1 9 Best Language: 0 10 Dysarthria: 0 11 Extinct. and Inatten.:0  TOTAL: 2   Labs I have reviewed labs in epic and the results pertinent to this consultation are:  CBC    Component Value Date/Time   WBC 13.3 (H) 09/30/2016 1634   RBC 5.37 09/30/2016  1634   HGB 16.0 09/30/2016 1646   HCT 47.0 09/30/2016 1646   PLT 268 09/30/2016 1634   MCV 84.9 09/30/2016 1634   MCH 29.4 09/30/2016 1634   MCHC 34.6 09/30/2016 1634   RDW 14.7 09/30/2016 1634   LYMPHSABS 4.2 (H) 09/30/2016 1634   MONOABS 1.4 (H) 09/30/2016 1634   EOSABS 0.2 09/30/2016 1634   BASOSABS 0.0 09/30/2016 1634    CMP     Component Value Date/Time   NA 142 09/30/2016 1646   K 3.9 09/30/2016 1646   CL 105 09/30/2016 1646   CO2 23 09/30/2016 1634   GLUCOSE 98 09/30/2016 1646   BUN 21 (H) 09/30/2016 1646   CREATININE 1.20 09/30/2016 1646   CALCIUM 9.1 09/30/2016  1634   PROT 7.7 09/30/2016 1634   ALBUMIN 4.2 09/30/2016 1634   AST 24 09/30/2016 1634   ALT 23 09/30/2016 1634   ALKPHOS 57 09/30/2016 1634   BILITOT 0.5 09/30/2016 1634   GFRNONAA >60 09/30/2016 1634   GFRAA >60 09/30/2016 1634    Imaging I have reviewed the images obtained: Noncontrast CT of the head showed no acute abnormalities. Aspects 10. CTA GM of the head and neck and perfusion also showed no acute abnormalities or evidence of large vessel occlusion, there is some artifact on the left carotid bulb, unclear of its significance.  Assessment:  46 year old man with a past medical history, hypertension, hyperlipidemia as well as migraines and drug use-cocaine, presents for evaluation of acute onset of headache, double vision, left-sided weakness including left facial weakness. His exam had some inconsistencies in terms of only having subjective sensory loss and having some left-sided weakness on individual muscle testing but no drift, but his gaze was definitely's slightly disconjugate, which may be suspect a brainstem etiology for his stroke as a differential. Although complex migraine was on top of my differentials but given that he was running against the 4-1/2 hour time window for receiving TPA and we could not get in status MRI in a time window that would still keep him under the 4-1/2 hour window,  I did decide to offer him IV TPA since a brainstem stroke might be devastating for him. Both him and his wife agreed to [facility] administration. I did discuss with them the side effects and chances of compensations including 6% chance of bleed.  Impression: Evaluate for acute ischemic stroke versus TIA Evaluate for complex migraine ?Evaluate for hypertensive encephalopathy-systolic pressures in the emergency room were less than 150.  Plan Acute Ischemic Stroke/TIA Acuity: Acute Current Suspected Etiology: small vessel vs embolic vs vasospasm  in the setting of cocaine use Continue Evaluation:  -Admit to: ICU -Hold Aspirin until 24 hour post tPA neuroimaging is stable and without evidence of bleeding -Blood pressure control, goal of SYS <180 - status post TPA -MRI/ECHO/A1C/Lipid panel. -Hyperglycemia management per SSI to maintain glucose 140-180mg /dL. -PT/OT/ST therapies and recommendations when able  CNS Dysarthria Dysphagia following cerebral infarction  -NPO until cleared by speech -ST -Advance diet as tolerated  Hemiplegia and hemiparesis following cerebral infarction affecting left non-dominant side  -PT/OT  RESP No acute issues Breathing normally and saturating normally at room air   CV Essential (primary) hypertension -Aggressive BP control, goal SBP <180  -TTE -Given recent history of cocaine use, check troponin  Hyperlipidemia, unspecified  - Statin for goal LDL < 70  HEME -Monitor -transfuse for hgb < 7  ENDO -goal HgbA1c < 7  GI/GU -Gentle hydration - normal saline at 75 an hour  Fluid/Electrolyte Disorders -Repeat lytes  -Trend bmp  ID Possible Aspiration PNA or UTI -Mild leukocytosis - no antibodies. -CXR -Monitor -UA & Urine culture pending   Nutrition E66.9 Obesity  -diet consult  Prophylaxis DVT:  SCDs GI: Not indicated Bowel: Docs senna  Diet: NPO until cleared by speech or bedside swallow  Code Status: Full Code  THE FOLLOWING  WERE PRESENT ON ADMISSION: CNS -  Acute Ischemic Stroke, Hemiparesis,  Respiratory - probable Aspiration Pneumonia Cardiovascular - baseline hypertension Infectious - possible UTI or pneumonia    Acute code stroke metrics Code stroke initiated at [place] hrs. Patient arrived at [facility] ED- 1718 hrs. IV TPA given-1802 hrs. (daughter needle-44 minutes) Delays in the process- --Inconsistent exam --  Transfer from another hospital-[facility] --Had to obtain CTA head and neck to rule out posterior circulation occlusion.  Critical care attestation This patient is critically ill and at significant risk of neurological worsening, death and care requires constant monitoring of vital signs, hemodynamics,respiratory and cardiac monitoring, neurological assessment, discussion with family, other specialists and medical decision making of high complexity. I spent 45  minutes of neurocritical care time  in the care of  this patient.  Amie Portland, MD [facility] [phone]  If 7pm to 7am, please call on call as listed on AMION.
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
## 2016-09-30 NOTE — ED Notes (Signed)
Neuro at bedside speaking with wife

## 2016-09-30 NOTE — ED Notes (Signed)
He has just been assisted onto a stretcher and left via [facility] without incident. His wife was directed by [facility] as to where to go at [facility], and she left just before [facility].

## 2016-09-30 NOTE — ED Notes (Signed)
Secretary notified [facility] of emergent transport to [facility] ED. Advised Dr Sherry Ruffing that truck should be here within 1 minute.

## 2016-09-30 NOTE — ED Provider Notes (Signed)
Patient is a 46 year old male who is a transfer from [facility] emergency department as a code stroke. He reports left side numbness and weakness that started within the last 1-2 hours. He has a little bit of facial droop. He is awake and alert and talking normally. He has some slight facial drooping. He has slight weakness in the left arm as compared to the right and the left leg as compared to the right.He has disconjugate gaze as well. No evidence of large vessel occlusion on imaging studies. Dr. Rory Percy is at bedside and administering TPA. He will be admitted to the neurosurgical ICU.   Malvin Johns, MD 09/30/16 [phone]

## 2016-09-30 NOTE — ED Notes (Signed)
Patient arrived to [facility] ED and met at bridge by Rory Percy neuro at [phone]

## 2016-09-30 NOTE — ED Notes (Signed)
Pt reports the numbness is getting better.

## 2016-09-30 NOTE — ED Notes (Signed)
Report called to [place] on 4N, request pt to come at [facility]
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
## 2016-09-30 NOTE — ED Notes (Signed)
Delay in lab draw, pt is receiving TPA

## 2016-09-30 NOTE — ED Provider Notes (Signed)
[facility] DE[facility] Provider Note   CSN: [id number] Arrival date & time: 09/30/16  1622     History   Chief Complaint Chief Complaint  Patient presents with  . Stroke-Like Symptoms    HPI Justin [name] is a 46 y.o. male.      The history is provided by the patient.  Neurologic Problem  This is a new problem. The current episode started 1 to 2 hours ago. The problem occurs constantly. The problem has not changed since onset.Associated symptoms include headaches. Pertinent negatives include no chest pain, no abdominal pain and no shortness of breath. Nothing aggravates the symptoms. Nothing relieves the symptoms. He has tried nothing for the symptoms. The treatment provided no relief.    Past Medical History:  Diagnosis Date  . Arrhythmia   . Arthritis    Both knees  . Chicken pox    As a child  . High cholesterol   . Hypertension    on meds since 1998   . Left ventricular hypertrophy    card work up [facility] ? inc QRS  . Migraines    Triggered by stress/tension  . OSA (obstructive sleep apnea)     Patient Active Problem List   Diagnosis Date Noted  . Bilateral groin pain 11/30/2013  . Lumbago 11/30/2013  . Urinary frequency 11/30/2013  . Lower urinary tract symptoms (LUTS) 11/30/2013  . Right-sided low back pain without sciatica 11/23/2013  . Achilles tendinitis of right lower extremity 09/23/2013  . Hyperglycemia 08/12/2013  . OSA (obstructive sleep apnea) 08/12/2013  . Morbid obesity ([place]) 08/12/2013  . Hypertension   . Left ventricular hypertrophy   . High cholesterol     Past Surgical History:  Procedure Laterality Date  . KNEE ARTHROPLASTY Left    left 2013 ligaments        Home Medications    Prior to Admission medications   Medication Sig Start Date End Date Taking? Authorizing Provider  gatifloxacin (ZYMAXID) 0.5 % SOLN Place 1 drop into the left eye 4 (four) times daily. 03/31/16   Gareth Morgan, MD  hydrochlorothiazide  (HYDRODIURIL) 25 MG tablet TAKE 1 TABLET (25 MG TOTAL) BY MOUTH DAILY. 05/07/14   Panosh, Standley Brooking, MD  HYDROcodone-acetaminophen (NORCO) 5-325 MG tablet Take 1-2 tablets by mouth every 6 (six) hours as needed (for pain). 03/31/16   Molpus, John, MD  hydrocortisone (ANUSOL-HC) 25 MG suppository  02/17/14   [provider]  ibuprofen (ADVIL,MOTRIN) 800 MG tablet Take 800 mg by mouth every 8 (eight) hours as needed.    [provider]  lisinopril (PRINIVIL,ZESTRIL) 10 MG tablet TAKE 1 TABLET (10 MG TOTAL) BY MOUTH DAILY. 05/07/14   Panosh, Standley Brooking, MD    Family History Family History  Problem Relation Age of Onset  . Hypertension Mother   . Other Father        murdered   . Breast cancer Maternal Grandmother   . Breast cancer Maternal Aunt        x 3  . Colon cancer Neg Hx     Social History Social History  Substance Use Topics  . Smoking status: Current Every Day Smoker    Packs/day: 0.25    Years: 30.00    Types: Cigarettes  . Smokeless tobacco: Never Used     Comment: tobacco info given 02/24/14  . Alcohol use 0.0 oz/week     Comment: rarely     Allergies   Penicillins   Review of Systems Review of Systems  Constitutional: Negative for chills, diaphoresis, fatigue and fever.  HENT: Negative for congestion and rhinorrhea.   Eyes: Positive for visual disturbance (blurry vision).  Respiratory: Negative for chest tightness, shortness of breath, wheezing and stridor.   Cardiovascular: Negative for chest pain.  Gastrointestinal: Negative for abdominal pain, diarrhea, nausea and vomiting.  Genitourinary: Negative for dysuria and flank pain.  Musculoskeletal: Negative for back pain, neck pain and neck stiffness.  Skin: Negative for wound.  Neurological: Positive for weakness, numbness and headaches. Negative for seizures and light-headedness.  Psychiatric/Behavioral: Negative for agitation.  All other systems reviewed and are negative.    Physical Exam Updated  Vital Signs BP (!) 136/96 (BP Location: Right Arm)   Pulse 75   Temp 98.1 F (36.7 C) (Oral)   Resp 18   SpO2 98%   Physical Exam  Constitutional: He is oriented to person, place, and time. He appears well-developed and well-nourished. No distress.  HENT:  Head: Normocephalic.  Mouth/Throat: Oropharynx is clear and moist. No oropharyngeal exudate.  Eyes: Pupils are equal, round, and reactive to light. Right eye exhibits no nystagmus. Left eye exhibits nystagmus.  Neck: Normal range of motion.  Cardiovascular: Normal rate and intact distal pulses.   No murmur heard. Pulmonary/Chest: Effort normal. No stridor. He has no wheezes. He exhibits no tenderness.  Abdominal: Soft. There is no tenderness.  Musculoskeletal: He exhibits no tenderness.  Neurological: He is alert and oriented to person, place, and time. He is not disoriented. He displays no tremor. A sensory deficit is present. No cranial nerve deficit. He exhibits abnormal muscle tone. Coordination normal. GCS eye subscore is 4. GCS verbal subscore is 5. GCS motor subscore is 6.  Skin: Capillary refill takes less than 2 seconds. He is not diaphoretic. No erythema.  Psychiatric: He has a normal mood and affect.  Nursing note and vitals reviewed.    ED Treatments / Results  Labs (all labs ordered are listed, but only abnormal results are displayed) Labs Reviewed  APTT - Abnormal; Notable for the following:       Result Value   aPTT 39 (*)    All other components within normal limits  CBC - Abnormal; Notable for the following:    WBC 13.3 (*)    All other components within normal limits  DIFFERENTIAL - Abnormal; Notable for the following:    Lymphs Abs 4.2 (*)    Monocytes Absolute 1.4 (*)    All other components within normal limits  I-STAT CHEM 8, ED - Abnormal; Notable for the following:    BUN 21 (*)    Calcium, Ion 1.14 (*)    All other components within normal limits  MRSA PCR SCREENING  PROTIME-INR    COMPREHENSIVE METABOLIC PANEL  ETHANOL  HIV ANTIBODY (ROUTINE TESTING)  HEMOGLOBIN A1C  LIPID PANEL  RAPID URINE DRUG SCREEN, HOSP PERFORMED  I-STAT TROPONIN, ED  CBG MONITORING, ED    EKG  EKG Interpretation  Date/Time:  Sunday September 30 2016 17:52:51 EDT Ventricular Rate:  68 PR Interval:    QRS Duration: 127 QT Interval:  425 QTC Calculation: 452 R Axis:   -30 Text Interpretation:  Sinus rhythm Right bundle branch block since last tracing no significant change Confirmed by Malvin Johns [phone]) on 09/30/2016 6:00:15 PM       Radiology Ct Angio Head W Or Wo Contrast  Result Date: 09/30/2016 CLINICAL DATA:  46 y/o male with left side weakness and blurred vision. Code stroke presentation. Possibly complicated migraine,  but also history of cocaine. EXAM: CT ANGIOGRAPHY HEAD AND NECK CT PERFUSION BRAIN TECHNIQUE: Multidetector CT imaging of the head and neck was performed using the standard protocol during bolus administration of intravenous contrast. Multiplanar CT image reconstructions and MIPs were obtained to evaluate the vascular anatomy. Carotid stenosis measurements (when applicable) are obtained utilizing NASCET criteria, using the distal internal carotid diameter as the denominator. Multiphase CT imaging of the brain was performed following IV bolus contrast injection. Subsequent parametric perfusion maps were calculated using RAPID software. CONTRAST:  90 mL Isovue 370 COMPARISON:  Noncontrast head CT 1643 hours today FINDINGS: CT Brain Perfusion Findings: CBF (<30%) Volume: 0 mL Perfusion (Tmax>6.0s) volume: 108 mL, but in a symmetric bilateral pattern felt to be artifactual. Mismatch Volume: Not applicable Infarction Location:  Not applicable CTA NECK Skeleton: Lower cervical spine chronic disc and endplate degeneration. Chronic left orbital floor fracture. Small volume herniated intraorbital fat. No acute osseous abnormality identified. Right maxillary sinus mucous retention  cyst. Upper chest: Minor dependent atelectasis. No superior mediastinal lymphadenopathy. Other neck: Negative.  No cervical lymphadenopathy. Aortic arch: 3 vessel arch configuration. No arch atherosclerosis or great vessel origin stenosis. Right carotid system: Negative aside from mild motion artifact and mild right ICA tortuosity. Left carotid system: Negative aside from mild motion artifact and mild left ICA tortuosity. Vertebral arteries:No proximal subclavian artery stenosis. Normal right vertebral artery origin. Codominant somewhat diminutive vertebral arteries, the right is patent to the skullbase without stenosis. The left vertebral artery origin is mildly obscured by dense adjacent left subclavian vein contrast, but appears within normal limits. No left vertebral artery stenosis identified to the skullbase. CTA HEAD Posterior circulation: Patent distal vertebral arteries without stenosis, the right is mildly dominant distal to the left PICA origin which is normal. The right AICA appears dominant. Patent vertebrobasilar junction and basilar artery without stenosis. Patent SCA and PCA origins. Fetal type right PCA origin. The left posterior communicating artery is present. Bilateral PCA branches appear normal. Anterior circulation: Both ICA siphons are patent with no atherosclerosis or stenosis identified. Ophthalmic and posterior communicating artery origins are normal. Carotid termini, MCA and ACA origins are normal. Diminutive anterior communicating artery. Bilateral ACA branches are normal. Left MCA M1 segment, bifurcation and left MCA branches are within normal limits. Right MCA M1 segment, bifurcation, and right MCA branches are within normal limits. Venous sinuses: Patent. Anatomic variants: Fetal type PCA origins, more so the right. Review of the MIP images confirms the above findings IMPRESSION: 1. Negative for emergent large vessel occlusion. No atherosclerosis or stenosis identified in the head or  neck. 2. Mild motion artifact. CT Perfusion does not detect core infarct, but perfusion analysis suggests prolonged T-max in bilateral hemispheric white matter which is felt to be artifact. 3. The study was reviewed in person with Dr. Amie Portland on 09/30/2016 at 1751 hours. Electronically Signed   By: Genevie Ann M.D.   On: 09/30/2016 18:05   Ct Head Wo Contrast  Result Date: 09/30/2016 CLINICAL DATA:  Altered mental status. Status post tPA administration. EXAM: CT HEAD WITHOUT CONTRAST TECHNIQUE: Contiguous axial images were obtained from the base of the skull through the vertex without intravenous contrast. COMPARISON:  CT perfusion of the brain 09/30/2016 FINDINGS: Brain: No mass lesion, intraparenchymal hemorrhage or extra-axial collection. No evidence of acute cortical infarct. Brain parenchyma and CSF-containing spaces are normal for age. Incidentally noted partially empty sella. Vascular: No hyperdense vessel or unexpected calcification. Skull: Normal visualized skull base, calvarium and extracranial  soft tissues. Sinuses/Orbits: Bilateral maxillary retention cysts. No mastoid or middle ear effusion. Normal orbits. IMPRESSION: Normal CT of the brain. Electronically Signed   By: Ulyses Jarred M.D.   On: 09/30/2016 23:51   Ct Angio Neck W Or Wo Contrast  Result Date: 09/30/2016 CLINICAL DATA:  46 y/o male with left side weakness and blurred vision. Code stroke presentation. Possibly complicated migraine, but also history of cocaine. EXAM: CT ANGIOGRAPHY HEAD AND NECK CT PERFUSION BRAIN TECHNIQUE: Multidetector CT imaging of the head and neck was performed using the standard protocol during bolus administration of intravenous contrast. Multiplanar CT image reconstructions and MIPs were obtained to evaluate the vascular anatomy. Carotid stenosis measurements (when applicable) are obtained utilizing NASCET criteria, using the distal internal carotid diameter as the denominator. Multiphase CT imaging of the  brain was performed following IV bolus contrast injection. Subsequent parametric perfusion maps were calculated using RAPID software. CONTRAST:  90 mL Isovue 370 COMPARISON:  Noncontrast head CT 1643 hours today FINDINGS: CT Brain Perfusion Findings: CBF (<30%) Volume: 0 mL Perfusion (Tmax>6.0s) volume: 108 mL, but in a symmetric bilateral pattern felt to be artifactual. Mismatch Volume: Not applicable Infarction Location:  Not applicable CTA NECK Skeleton: Lower cervical spine chronic disc and endplate degeneration. Chronic left orbital floor fracture. Small volume herniated intraorbital fat. No acute osseous abnormality identified. Right maxillary sinus mucous retention cyst. Upper chest: Minor dependent atelectasis. No superior mediastinal lymphadenopathy. Other neck: Negative.  No cervical lymphadenopathy. Aortic arch: 3 vessel arch configuration. No arch atherosclerosis or great vessel origin stenosis. Right carotid system: Negative aside from mild motion artifact and mild right ICA tortuosity. Left carotid system: Negative aside from mild motion artifact and mild left ICA tortuosity. Vertebral arteries:No proximal subclavian artery stenosis. Normal right vertebral artery origin. Codominant somewhat diminutive vertebral arteries, the right is patent to the skullbase without stenosis. The left vertebral artery origin is mildly obscured by dense adjacent left subclavian vein contrast, but appears within normal limits. No left vertebral artery stenosis identified to the skullbase. CTA HEAD Posterior circulation: Patent distal vertebral arteries without stenosis, the right is mildly dominant distal to the left PICA origin which is normal. The right AICA appears dominant. Patent vertebrobasilar junction and basilar artery without stenosis. Patent SCA and PCA origins. Fetal type right PCA origin. The left posterior communicating artery is present. Bilateral PCA branches appear normal. Anterior circulation: Both ICA  siphons are patent with no atherosclerosis or stenosis identified. Ophthalmic and posterior communicating artery origins are normal. Carotid termini, MCA and ACA origins are normal. Diminutive anterior communicating artery. Bilateral ACA branches are normal. Left MCA M1 segment, bifurcation and left MCA branches are within normal limits. Right MCA M1 segment, bifurcation, and right MCA branches are within normal limits. Venous sinuses: Patent. Anatomic variants: Fetal type PCA origins, more so the right. Review of the MIP images confirms the above findings IMPRESSION: 1. Negative for emergent large vessel occlusion. No atherosclerosis or stenosis identified in the head or neck. 2. Mild motion artifact. CT Perfusion does not detect core infarct, but perfusion analysis suggests prolonged T-max in bilateral hemispheric white matter which is felt to be artifact. 3. The study was reviewed in person with Dr. Amie Portland on 09/30/2016 at 1751 hours. Electronically Signed   By: Genevie Ann M.D.   On: 09/30/2016 18:05   Ct Cerebral Perfusion W Contrast  Result Date: 09/30/2016 CLINICAL DATA:  46 y/o male with left side weakness and blurred vision. Code stroke presentation. Possibly complicated  migraine, but also history of cocaine. EXAM: CT ANGIOGRAPHY HEAD AND NECK CT PERFUSION BRAIN TECHNIQUE: Multidetector CT imaging of the head and neck was performed using the standard protocol during bolus administration of intravenous contrast. Multiplanar CT image reconstructions and MIPs were obtained to evaluate the vascular anatomy. Carotid stenosis measurements (when applicable) are obtained utilizing NASCET criteria, using the distal internal carotid diameter as the denominator. Multiphase CT imaging of the brain was performed following IV bolus contrast injection. Subsequent parametric perfusion maps were calculated using RAPID software. CONTRAST:  90 mL Isovue 370 COMPARISON:  Noncontrast head CT 1643 hours today FINDINGS: CT  Brain Perfusion Findings: CBF (<30%) Volume: 0 mL Perfusion (Tmax>6.0s) volume: 108 mL, but in a symmetric bilateral pattern felt to be artifactual. Mismatch Volume: Not applicable Infarction Location:  Not applicable CTA NECK Skeleton: Lower cervical spine chronic disc and endplate degeneration. Chronic left orbital floor fracture. Small volume herniated intraorbital fat. No acute osseous abnormality identified. Right maxillary sinus mucous retention cyst. Upper chest: Minor dependent atelectasis. No superior mediastinal lymphadenopathy. Other neck: Negative.  No cervical lymphadenopathy. Aortic arch: 3 vessel arch configuration. No arch atherosclerosis or great vessel origin stenosis. Right carotid system: Negative aside from mild motion artifact and mild right ICA tortuosity. Left carotid system: Negative aside from mild motion artifact and mild left ICA tortuosity. Vertebral arteries:No proximal subclavian artery stenosis. Normal right vertebral artery origin. Codominant somewhat diminutive vertebral arteries, the right is patent to the skullbase without stenosis. The left vertebral artery origin is mildly obscured by dense adjacent left subclavian vein contrast, but appears within normal limits. No left vertebral artery stenosis identified to the skullbase. CTA HEAD Posterior circulation: Patent distal vertebral arteries without stenosis, the right is mildly dominant distal to the left PICA origin which is normal. The right AICA appears dominant. Patent vertebrobasilar junction and basilar artery without stenosis. Patent SCA and PCA origins. Fetal type right PCA origin. The left posterior communicating artery is present. Bilateral PCA branches appear normal. Anterior circulation: Both ICA siphons are patent with no atherosclerosis or stenosis identified. Ophthalmic and posterior communicating artery origins are normal. Carotid termini, MCA and ACA origins are normal. Diminutive anterior communicating artery.  Bilateral ACA branches are normal. Left MCA M1 segment, bifurcation and left MCA branches are within normal limits. Right MCA M1 segment, bifurcation, and right MCA branches are within normal limits. Venous sinuses: Patent. Anatomic variants: Fetal type PCA origins, more so the right. Review of the MIP images confirms the above findings IMPRESSION: 1. Negative for emergent large vessel occlusion. No atherosclerosis or stenosis identified in the head or neck. 2. Mild motion artifact. CT Perfusion does not detect core infarct, but perfusion analysis suggests prolonged T-max in bilateral hemispheric white matter which is felt to be artifact. 3. The study was reviewed in person with Dr. Amie Portland on 09/30/2016 at 1751 hours. Electronically Signed   By: Genevie Ann M.D.   On: 09/30/2016 18:05   Dg Chest Port 1 View  Result Date: 09/30/2016 CLINICAL DATA:  Stroke EXAM: PORTABLE CHEST 1 VIEW COMPARISON:  01/27/2015 FINDINGS: Elevated right diaphragm. Patchy bibasilar left greater than right atelectasis. No pleural effusion. Stable borderline to mild cardiomegaly. No pneumothorax. IMPRESSION: Low lung volumes with patchy left greater than right basilar atelectasis. Borderline cardiomegaly. Electronically Signed   By: Donavan Foil M.D.   On: 09/30/2016 23:24   Ct Head Code Stroke Wo Contrast  Addendum Date: 09/30/2016   ADDENDUM REPORT: 09/30/2016 17:52 ADDENDUM: Study discussed by telephone  with Dr. Rory Percy on 09/30/2016 at 1728 hours. Electronically Signed   By: Genevie Ann M.D.   On: 09/30/2016 17:52   Result Date: 09/30/2016 CLINICAL DATA:  Code stroke. 46 year old male with sudden onset left facial numbness and left extremity numbness and weakness. Ataxia. EXAM: CT HEAD WITHOUT CONTRAST TECHNIQUE: Contiguous axial images were obtained from the base of the skull through the vertex without intravenous contrast. COMPARISON:  Orbit CT 03/31/2016. FINDINGS: Brain: Partially empty sella. No midline shift, ventriculomegaly,  mass effect, evidence of mass lesion, intracranial hemorrhage or evidence of cortically based acute infarction. Gray-white matter differentiation is within normal limits throughout the brain. Vascular: No suspicious intracranial vascular hyperdensity. Skull: Chronic left orbital floor fracture. Stable visualized osseous structures. No acute osseous abnormality identified. Sinuses/Orbits: Stable and mostly well pneumatized. Other: No acute orbit or scalp soft tissue finding. ASPECTS [facility] Stroke Program Early CT Score) - Ganglionic level infarction (caudate, lentiform nuclei, internal capsule, insula, M1-M3 cortex): 7 - Supraganglionic infarction (M4-M6 cortex): 3 Total score (0-10 with 10 being normal): 10 IMPRESSION: 1. Normal noncontrast CT appearance of the brain. 2. ASPECTS is 10. 3. The above was relayed via text pager to Dr. Jerelyn Charles on 09/30/2016 at 17:19 . Electronically Signed: By: Genevie Ann M.D. On: 09/30/2016 17:19    Procedures Procedures (including critical care time)  CRITICAL CARE Performed by: Gwenyth Allegra Caitlain Tweed Total critical care time: 35 minutes Critical care time was exclusive of separately billable procedures and treating other patients. Code stroke called, pt emergently transferred and received TPA with neurology.  Critical care was necessary to treat or prevent imminent or life-threatening deterioration. Critical care was time spent personally by me on the following activities: development of treatment plan with patient and/or surrogate as well as nursing, discussions with consultants, evaluation of patient's response to treatment, examination of patient, obtaining history from patient or surrogate, ordering and performing treatments and interventions, ordering and review of laboratory studies, ordering and review of radiographic studies, pulse oximetry and re-evaluation of patient's condition.  Medications Ordered in ED Medications  iopamidol (ISOVUE-370) 76 % injection  (not administered)   stroke: mapping our early stages of recovery book (not administered)  0.9 %  sodium chloride infusion ( Intravenous Transfusing/Transfer 09/30/16 2150)  acetaminophen (TYLENOL) tablet 650 mg (not administered)    Or  acetaminophen (TYLENOL) solution 650 mg (not administered)    Or  acetaminophen (TYLENOL) suppository 650 mg (not administered)  senna-docusate (Senokot-S) tablet 1 tablet (not administered)  labetalol (NORMODYNE,TRANDATE) injection 20 mg (0 mg Intravenous Hold 09/30/16 1857)    And  nicardipine (CARDENE) 20mg  in 0.86% saline 233ml IV infusion (0.1 mg/ml) (0 mg/hr Intravenous Hold 09/30/16 1857)  iopamidol (ISOVUE-370) 76 % injection (90 mLs  Contrast Given 09/30/16 1729)  alteplase (ACTIVASE) 1 mg/mL infusion 90 mg (0 mg Intravenous Stopped 09/30/16 1902)     Initial Impression / Assessment and Plan / ED Course  I have reviewed the triage vital signs and the nursing notes.  Pertinent labs & imaging results that were available during my care of the patient were reviewed by me and considered in my medical decision making (see chart for details).     Justin [name] is a 46 y.o. male with a past medical history significant for hypertension, left ventricular hypertrophy, sleep apnea, obesity, and cocaine abuse who presents with neurologic complaints. Patient says that at 3:30 PM, he had sudden onset of left-sided numbness and weakness. He was [place] [address] when he noticed  the symptoms. He presents to the emergency for evaluation. He reports that he is having numbness on his left face, left arm, and left leg. 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 He says that he noticed a facial droop and felt weak in the left arm and left leg. He has never had these symptoms in the past. He does report some mild blurry vision in his left eye. He reports that he is cocaine approximately 2 days ago but has not used it  since. He does report he has had increase in stress recently. He reports he has chronic headaches.  According to nursing staff, they noticed a left-sided facial droop and dragging left leg while getting patient to bed. On my initial evaluation, I did not see evidence of a facial droop or left leg weakness. Patient did have left arm weakness compared to the right. Patient was alert and oriented 3. Patient reports sensation deficits in the left face, left arm, and left leg. Also patient had some nystagmus in the left eye when extraocular movements were assessed. Patient's lungs were clear and chest is nontender. Abdomen nontender. Pupils were symmetric and reactive bilaterally. Normal finger-nose-finger bilaterally.  Initial vital signs reassuring. Patient is afebrile, not tachycardic, and blood pressure was not hypertensive or hypotensive. Patient having normal saturations on room air. Airway was felt to be intact.   Initial glucose 91  Given patient's neurologic deficits that began one hour prior to arrival, code stroke will be called. Patient received non-con CT scan and patient will be transferred to the care of Dr. Rory Percy at [facility] any excepting emergency physician will be Dr. Ralene Bathe.   Transport emergently obtained and patient transferred in stable condition.         Final Clinical Impressions(s) / ED Diagnoses   Final diagnoses:  Acute ischemic stroke ([place])  Stroke [facility])     Clinical Impression: 1. Acute ischemic stroke ([place])   2. Stroke [facility])     Disposition: [facility] to [facility] for neurology evaluation    Ido Wollman, Gwenyth Allegra, MD 10/01/16 [phone]

## 2016-09-30 NOTE — ED Notes (Signed)
pts eyes are red bilaterally, present prior to starting tpa

## 2016-09-30 NOTE — ED Notes (Signed)
[facility] is en route. Per neurologist--no TPA--no mri at [place]; all other intervention will be performed at [facility].  Pt. Remains in no distress.

## 2016-10-01 ENCOUNTER — Inpatient Hospital Stay (HOSPITAL_COMMUNITY): Payer: Self-pay

## 2016-10-01 DIAGNOSIS — I639 Cerebral infarction, unspecified: Secondary | ICD-10-CM

## 2016-10-01 DIAGNOSIS — E7800 Pure hypercholesterolemia, unspecified: Secondary | ICD-10-CM

## 2016-10-01 DIAGNOSIS — F17200 Nicotine dependence, unspecified, uncomplicated: Secondary | ICD-10-CM

## 2016-10-01 DIAGNOSIS — G4733 Obstructive sleep apnea (adult) (pediatric): Secondary | ICD-10-CM

## 2016-10-01 DIAGNOSIS — F1490 Cocaine use, unspecified, uncomplicated: Secondary | ICD-10-CM

## 2016-10-01 DIAGNOSIS — I10 Essential (primary) hypertension: Secondary | ICD-10-CM

## 2016-10-01 LAB — LIPID PANEL
CHOL/HDL RATIO: 3.9 ratio
CHOLESTEROL: 160 mg/dL (ref 0–200)
HDL: 41 mg/dL (ref >40)
LDL Cholesterol: 105 mg/dL — ABNORMAL HIGH (ref 0–99)
Triglycerides: 72 mg/dL (ref <150)
VLDL: 14 mg/dL (ref 0–40)

## 2016-10-01 LAB — ECHOCARDIOGRAM COMPLETE
HEIGHTINCHES: 68 in
Weight: 4321.02 oz

## 2016-10-01 LAB — RAPID URINE DRUG SCREEN, HOSP PERFORMED
Amphetamines: NOT DETECTED
BARBITURATES: NOT DETECTED
BENZODIAZEPINES: NOT DETECTED
COCAINE: POSITIVE — AB
OPIATES: NOT DETECTED
Tetrahydrocannabinol: NOT DETECTED

## 2016-10-01 LAB — HEMOGLOBIN A1C
HEMOGLOBIN A1C: 5.6 % (ref 4.8–5.6)
MEAN PLASMA GLUCOSE: 114.02 mg/dL

## 2016-10-01 LAB — HIV ANTIBODY (ROUTINE TESTING W REFLEX): HIV Screen 4th Generation wRfx: NONREACTIVE

## 2016-10-01 MED ORDER — ATORVASTATIN CALCIUM 10 MG PO TABS
20.0000 mg | ORAL_TABLET | Freq: Every day | ORAL | Status: DC
  Administered 2016-10-02: 20 mg via ORAL
  Filled 2016-10-01: qty 2

## 2016-10-01 MED ORDER — HYDRALAZINE HCL 20 MG/ML IJ SOLN: 5.0000 mg | INTRAMUSCULAR | Status: DC | PRN

## 2016-10-01 MED ORDER — LABETALOL HCL 5 MG/ML IV SOLN: 10.0000 mg | INTRAVENOUS | Status: DC | PRN

## 2016-10-01 NOTE — Evaluation (Signed)
Speech Language Pathology Evaluation Patient Details Name: Justin [name] MRN: [id number] DOB: 1970-07-19 Today's Date: 10/01/2016 Time: 3875-6433 SLP Time Calculation (min) (ACUTE ONLY): 9 min  Problem List:  Patient Active Problem List   Diagnosis Date Noted  . Acute ischemic stroke ([place]) 09/30/2016  . Bilateral groin pain 11/30/2013  . Lumbago 11/30/2013  . Urinary frequency 11/30/2013  . Lower urinary tract symptoms (LUTS) 11/30/2013  . Right-sided low back pain without sciatica 11/23/2013  . Achilles tendinitis of right lower extremity 09/23/2013  . Hyperglycemia 08/12/2013  . OSA (obstructive sleep apnea) 08/12/2013  . Morbid obesity ([place]) 08/12/2013  . Hypertension   . Left ventricular hypertrophy   . High cholesterol    Past Medical History:  Past Medical History:  Diagnosis Date  . Arrhythmia   . Arthritis    Both knees  . Chicken pox    As a child  . High cholesterol   . Hypertension    on meds since 1998   . Left ventricular hypertrophy    card work up [facility] QRS  . Migraines    Triggered by stress/tension  . OSA (obstructive sleep apnea)    Past Surgical History:  Past Surgical History:  Procedure Laterality Date  . KNEE ARTHROPLASTY Left    left 2013 ligaments    HPI:  [name] a 46 y.o.malepast medical history of hypertension, hyperlipidemia, migraines, obstructive sleep apnea, cocaine abuse. Pt noticed noticed that his left arm and leg felt weak as well left facial droop. Pt complained of double vision, left arm weakness, left leg weakness and left-sided tingling and numbness at the time of arrival when he was evaluated. STAT CT head is normal. Pt received TPA prior to MRI. MD questions complex migraine vs CVA.    Assessment / Plan / Recommendation Clinical Impression  Pt demonstrates cognitive linguistic function WNL. Very mild dysarthria noted, almost not recogniazable. No further interventions needed given expected  spontaneous recovery and level of severity. Will sign off.     SLP Assessment  SLP Visit Diagnosis: Dysarthria and anarthria (R47.1)    Follow Up Recommendations  None    Frequency and Duration           SLP Evaluation Cognition  Overall Cognitive Status: Within Functional Limits for tasks assessed Arousal/Alertness: Awake/alert Orientation Level: Oriented X4 Attention: Sustained Sustained Attention: Appears intact Memory: Appears intact Awareness: Appears intact Problem Solving: Appears intact Executive Function: Reasoning;Self Monitoring;Self Correcting;Initiating Reasoning: Appears intact Initiating: Appears intact Self Monitoring: Appears intact Self Correcting: Appears intact Safety/Judgment: Appears intact       Comprehension  Auditory Comprehension Overall Auditory Comprehension: Appears within functional limits for tasks assessed    Expression Verbal Expression Overall Verbal Expression: Appears within functional limits for tasks assessed   Oral / Motor  Oral Motor/Sensory Function Overall Oral Motor/Sensory Function: Mild impairment Facial Symmetry: Abnormal symmetry left Motor Speech Overall Motor Speech: Impaired Respiration: Within functional limits Phonation: Normal Resonance: Within functional limits Articulation: Impaired Level of Impairment: Conversation (very mild impairment) Intelligibility: Intelligible Motor Planning: Witnin functional limits   GO                   [employer], MA CCC-SLP [phone]  Lynann Beaver 10/01/2016, 10:02 AM

## 2016-10-01 NOTE — Progress Notes (Signed)
Patient became somnolent, worrisome for possible hemorrhagic conversion. STAT CT head is normal. After returning from CT patient is now conversing normally with nursing staff.  Electronically signed: Dr. Kerney Elbe

## 2016-10-01 NOTE — Progress Notes (Signed)
STROKE TEAM PROGRESS NOTE   HISTORY OF PRESENT ILLNESS (per record) Justin [name] is a 46 y.o. male past medical history of hypertension, hyperlipidemia, migraines, obstructive sleep apnea, cocaine abuse, who was in his usual state of health until about 2:15 PM, when he started noticing some left-sided tingling and numbness. He was on his way from [facility] to [place], stopped at a truck stop and noticed that his left arm and leg feel weak as well. He looked at himself in the mirror and saw left facial droop. He came in to [facility] long hospital where he was evaluated and a code stroke was activated. The patient was sent to [facility] emergency room as a code stroke. He complained of double vision, left arm weakness, left leg weakness and left-sided tingling and numbness at the time of arrival when he was evaluated. On my initial evaluation, his NIH stroke scale was 2 (see below) He denied using any cocaine today but endorses using cocaine on Friday. Denied using any alcohol. Denied any preceding illnesses. Next line denies fevers or chills. Denies nausea or vomiting at this time. Denied abdominal pain. Denied easy bleeding or bruising. Denied any recent surgeries. Denied any blood thinning medications currently he is on. He has had a recent loss of a family member because of which she has been very upset. His exam was not perfectly consistent, but given the laterality of symptoms as well as eye findings as documented below, I obtained a CT a and perfusion studies and then decided to give him IV TPA. He got the bolus at 1802 hours.  LKW: 2:15 PM tpa given?: Yes Premorbid modified Rankin scale (mRS):  0 Delays in the process: -Transfer from [employer] -Inconsistent exam. -Obtaining CTA and perfusion   SUBJECTIVE (INTERVAL HISTORY) No family is at the bedside.  Patient is aweak alert, follow commands, still complains of left-sided numbness. MRI pending. No side effect from  TPA.   OBJECTIVE Temp:  [97.8 F (36.6 C)-98.4 F (36.9 C)] 98.4 F (36.9 C) (08/27 0355) Pulse Rate:  [59-85] 65 (08/27 0700) Cardiac Rhythm: Normal sinus rhythm (08/27 0400) Resp:  [8-21] 8 (08/27 0700) BP: (110-160)/(57-105) 132/94 (08/27 0700) SpO2:  [84 %-100 %] 93 % (08/27 0700) Weight:  [122.5 kg (270 lb 1 oz)-123.1 kg (271 lb 6.2 oz)] 122.5 kg (270 lb 1 oz) (08/26 2000)  CBC:  Recent Labs Lab 09/30/16 1634 09/30/16 1646  WBC 13.3*  --   NEUTROABS 7.5  --   HGB 15.8 16.0  HCT 45.6 47.0  MCV 84.9  --   PLT 268  --     Basic Metabolic Panel:  Recent Labs Lab 09/30/16 1634 09/30/16 1646  NA 139 142  K 3.8 3.9  CL 108 105  CO2 23  --   GLUCOSE 99 98  BUN 19 21*  CREATININE 1.19 1.20  CALCIUM 9.1  --     Lipid Panel:    Component Value Date/Time   CHOL 160 10/01/2016 0202   TRIG 72 10/01/2016 0202   HDL 41 10/01/2016 0202   CHOLHDL 3.9 10/01/2016 0202   VLDL 14 10/01/2016 0202   LDLCALC 105 (H) 10/01/2016 0202   HgbA1c:  Lab Results  Component Value Date   HGBA1C 5.6 10/01/2016   Urine Drug Screen: No results found for: LABOPIA, COCAINSCRNUR, LABBENZ, AMPHETMU, THCU, LABBARB  Alcohol Level     Component Value Date/Time   ETH <5 09/30/2016 2030    IMAGING I have personally reviewed the radiological  images below and agree with the radiology interpretations.  Ct Angio Head and neck and CT perfusion W Or Wo Contrast  09/30/2016 IMPRESSION: 1. Negative for emergent large vessel occlusion. No atherosclerosis or stenosis identified in the head or neck. 2. Mild motion artifact. CT Perfusion does not detect core infarct, but perfusion analysis suggests prolonged T-max in bilateral hemispheric white matter which is felt to be artifact.  Ct Head Wo Contrast 09/30/2016 IMPRESSION: Normal CT of the brain.  Ct Head Code Stroke Wo Contrast 09/30/2016   IMPRESSION: 1. Normal noncontrast CT appearance of the brain. 2. ASPECTS is 10.   MRI brain  pending  TTE - - Left ventricle: The cavity size was normal. There was mild   concentric hypertrophy. Systolic function was normal. The   estimated ejection fraction was in the range of 60% to 65%. Wall   motion was normal; there were no regional wall motion   abnormalities. Left ventricular diastolic function parameters   were normal. - Pulmonary arteries: Systolic pressure could not be accurately   estimated.   PHYSICAL EXAM  Temp:  [97.8 F (36.6 C)-98.5 F (36.9 C)] 98.5 F (36.9 C) (08/27 1600) Pulse Rate:  [58-85] 65 (08/27 1600) Resp:  [8-21] 14 (08/27 1600) BP: (107-160)/(57-107) 131/76 (08/27 1600) SpO2:  [84 %-100 %] 96 % (08/27 1600) Weight:  [270 lb 1 oz (122.5 kg)] 270 lb 1 oz (122.5 kg) (08/26 2000)  General - Well nourished, well developed, in no apparent distress.  Ophthalmologic - Fundi not visualized due to small pupils.  Cardiovascular - Regular rate and rhythm.  Mental Status -  Level of arousal and orientation to time, place, and person were intact. Language including expression, naming, repetition, comprehension was assessed and found intact. Fund of Knowledge was assessed and was intact.  Cranial Nerves II - XII - II - Visual field intact OU. III, IV, VI - Extraocular movements intact. V - Facial sensation decreased on the left. VII - Facial movement intact bilaterally. VIII - Hearing & vestibular intact bilaterally. X - Palate elevates symmetrically. XI - Chin turning & shoulder shrug intact bilaterally. XII - Tongue protrusion intact.  Motor Strength - The patient's strength was normal in all extremities except chronic left foot drop with 1/5 DF and pronator drift was absent.  Bulk was normal and fasciculations were absent.   Motor Tone - Muscle tone was assessed at the neck and appendages and was normal.  Reflexes - The patient's reflexes were 1+ in all extremities and he had no pathological reflexes.  Sensory - Light touch,  temperature/pinprick were assessed and were decreased on the left, 90% of the right.    Coordination - The patient had normal movements in the hands with no ataxia or dysmetria.  Tremor was absent.  Gait and Station - deferred   ASSESSMENT/PLAN Justin [name] is a 46 y.o. male with history of hypertension, hyperlipidemia, OSA, cocaine abuse, current smoker, migraine presenting with left sided numbness tingling, left facial droop. He did receive IV t-PA at ER.   Suspected Stroke:  Possible right thalamic/IC infarct, secondary to small vessel disease source. Risk factor including HTN, HLD, cocaine use, OSA, smoking)  Resultant  left hemi-paresthesia  CT head no acute abnormality  MRI head pending  CTA head and neck and CTP unremarkable  2D Echo  EF 60-65%  UDS positive for cocaine  LDL 105  HgbA1c 5.6  SCDs for VTE prophylaxis  Diet heart healthy/carb modified Room service appropriate? Yes; Fluid  consistency: Thin  No antithrombotic prior to admission, now on No antithrombotic within 24 hours of TPA  Patient counseled to be compliant with his antithrombotic medications  Ongoing aggressive stroke risk factor management  Therapy recommendations:  Pending  Disposition:  Pending  Cocaine abuse  Patient admitted cocaine use  UDS showed positive cocaine  Cocaine cessation counseling provided  Patient is willing to quit  Tobacco abuse  Current smoker  Smoking cessation counseling provided  Pt is willing to quit  Hypertension  Stable  Permissive hypertension (OK if <180/105) but gradually normalize in 5-7 days  Long-term BP goal normotensive  Hyperlipidemia  Home meds:  None  LDL 105, goal < 70  Add Lipitor 20  Continue statin at discharge  Other Stroke Risk Factors  Advanced age  ETOH use, advised to drink no more than 1-2 drink(s) a day  Obesity, Body mass index is 41.06 kg/m., recommend weight loss, diet and exercise as appropriate    Migraines  Obstructive sleep apnea, not on CPAP at home  Other Active Problems  Leukocytosis 14.6 - 13.3  Hospital day # 1  This patient is critically ill due to strokelike symptoms, status post TPA, cocaine use and at significant risk of neurological worsening, death form recurrent stroke, hemorrhagic transmission, seizure. This patient's care requires constant monitoring of vital signs, hemodynamics, respiratory and cardiac monitoring, review of multiple databases, neurological assessment, discussion with family, other specialists and medical decision making of high complexity. I spent 35 minutes of neurocritical care time in the care of this patient.  Rosalin Hawking, MD PhD Stroke Neurology 10/01/2016 5:36 PM   To contact Stroke Continuity provider, please refer to [email or web address] After hours, contact General Neurology

## 2016-10-01 NOTE — Progress Notes (Signed)
Pt admitted to 5M7 at this time.  Alert and oriented.  In chair at present.  Call bell within reach.  Understands to call before attempting to get up.  Tele on and CCMD called and verified.

## 2016-10-01 NOTE — Progress Notes (Signed)
PT Cancellation Note  Patient Details Name: Justin [name] MRN: [id number] DOB: 1970-11-29   Cancelled Treatment:    Reason Eval/Treat Not Completed: Patient not medically ready. Pt currently on bedrest. Will await increased activity orders prior to initiating PT eval.    Thelma Comp 10/01/2016, 6:43 AM   Rolinda Roan, PT, DPT Acute Rehabilitation Services Pager: [phone]

## 2016-10-01 NOTE — Progress Notes (Signed)
  Echocardiogram 2D Echocardiogram has been performed.  Justin [name] M 10/01/2016, 2:51 PM

## 2016-10-01 NOTE — Progress Notes (Signed)
OT Cancellation Note  Patient Details Name: Justin [name] MRN: [id number] DOB: 24-Jan-1971   Cancelled Treatment:    Reason Eval/Treat Not Completed: Patient not medically ready. Pt on bedrest. Please update activity orders when appropriate for therapy. Thanks  [place], OT/L  [phone] 10/01/2016 10/01/2016, 6:43 AM

## 2016-10-01 NOTE — Progress Notes (Signed)
Pt became increasingly difficult to arouse. Admitted to the unit alert, then only responding to voice and progressed to physical stimulation/pain necessary to arouse. RN concerned for possible hemorrhagic conversion post-tPA and notified on-call neurologist at that time. Stat CT ordered and obtained - normal results per radiologist. Will continue to monitor closely.  Guadalupe Maple, RN

## 2016-10-02 ENCOUNTER — Encounter: Payer: Self-pay | Admitting: Neurology

## 2016-10-02 DIAGNOSIS — R200 Anesthesia of skin: Secondary | ICD-10-CM

## 2016-10-02 DIAGNOSIS — E785 Hyperlipidemia, unspecified: Secondary | ICD-10-CM

## 2016-10-02 DIAGNOSIS — F1410 Cocaine abuse, uncomplicated: Secondary | ICD-10-CM

## 2016-10-02 MED ORDER — ASPIRIN 81 MG PO TBEC: 81.0000 mg | DELAYED_RELEASE_TABLET | Freq: Every day | ORAL | 0 refills | Status: AC

## 2016-10-02 MED ORDER — ATORVASTATIN CALCIUM 20 MG PO TABS: 20.0000 mg | ORAL_TABLET | Freq: Every day | ORAL | 0 refills | Status: DC

## 2016-10-02 MED ORDER — ASPIRIN EC 81 MG PO TBEC
81.0000 mg | DELAYED_RELEASE_TABLET | Freq: Every day | ORAL | Status: DC
  Administered 2016-10-02: 81 mg via ORAL
  Filled 2016-10-02: qty 1

## 2016-10-02 NOTE — Evaluation (Signed)
Occupational Therapy Evaluation Patient Details Name: Justin [name] MRN: [id number] DOB: July 28, 1970 Today's Date: 10/02/2016    History of Present Illness Pt is a 46 y.o. male who presented to [facility] with L sided tingling and numbness as well as double vision. He was subsequently transferred to [facility] as a code stroke and recieved IV tPA at 1802. PMH significant for hypertension, hyperlipidemia, migraines, obstructive sleep apnea, and cocaine abuse. Imaging negative for acute infarct.   Clinical Impression   Patient evaluated by Occupational Therapy with no further acute OT needs identified. He demonstrates the ability to complete basic ADL in hospital setting with modified independence. Pt does report blurry vision in L eye and slight double vision with extreme adduction with L eye only (this is not present with bilateral eyes open). He was able to complete all functional vision tasks without difficulty and reports that he is waiting to get a new prescription filled for glasses. Educated pt concerning compensatory strategies to maximize functional use of vision and recommended that he follow-up with opthamologist and avoid driving until cleared by MD. Pt demonstrates understanding of all topics and has no further questions.. OT to sign off. Thank you for referral.      Follow Up Recommendations  No OT follow up    Equipment Recommendations  None recommended by OT    Recommendations for Other Services       Precautions / Restrictions Precautions Precautions: Fall Precaution Comments: L foot drop at baseline Restrictions Weight Bearing Restrictions: No      Mobility Bed Mobility Overal bed mobility: Independent                Transfers Overall transfer level: Modified independent                    Balance Overall balance assessment: No apparent balance deficits (not formally assessed)                                         ADL either performed or assessed with clinical judgement   ADL Overall ADL's : Modified independent;At baseline                                       General ADL Comments: Requires increased time due to L LE deficits at baseline but able to complete all basic ADL without physical assistance.      Vision Baseline Vision/History:  (Has prescription for glasses but has not ordered them yet.) Patient Visual Report: Other (comment) (Reports double vision when looking to the extreme R with L eye only (R eye occluded). ) Vision Assessment?: Yes Eye Alignment: Within Functional Limits Ocular Range of Motion: Within Functional Limits Alignment/Gaze Preference: Within Defined Limits Tracking/Visual Pursuits: Decreased smoothness of horizontal tracking (L eye with slight "jump" crossing midline. ) Saccades: Within functional limits Convergence: Within functional limits Visual Fields: No apparent deficits Diplopia Assessment:  (Only with L eye open and extreme adduction to the R. Not present with bilateral eyes open. ) Additional Comments: Pt able to utilize vision functionally during assessment. Reading small print on food items and receipt. Discussed need to be cleared by MD prior to resuming driving and vision compensatory techniques and he verbalizes and demonstrates understanding.      Perception  Praxis      Pertinent Vitals/Pain Pain Assessment: No/denies pain     Hand Dominance     Extremity/Trunk Assessment Upper Extremity Assessment Upper Extremity Assessment: Overall WFL for tasks assessed (Sensation in tact at this time. 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        Communication Communication Communication: No difficulties   Cognition Arousal/Alertness: Awake/alert Behavior  During Therapy: WFL for tasks assessed/performed Overall Cognitive Status: Within Functional Limits for tasks assessed                                     General Comments       Exercises     Shoulder Instructions      Home Living Family/patient expects to be discharged to:: Private residence Living Arrangements: Spouse/significant other Available Help at Discharge: Family;Available 24 hours/day Type of Home: House Home Access: Stairs to enter [employer] of Steps: 3 Entrance Stairs-Rails: None Home Layout: One level     Bathroom Shower/Tub: Walk-in shower;Tub/shower unit (uses walk-in shower)   Bathroom Toilet: Standard     Home Equipment: None      Lives With: Spouse    Prior Functioning/Environment Level of Independence: Independent        Comments: Working and drives long distances for work.        OT Problem List: Impaired vision/perception      OT Treatment/Interventions:      OT Goals(Current goals can be found in the care plan section) Acute Rehab OT Goals Patient Stated Goal: to go home and have some food with seasoning OT Goal Formulation: With patient  OT Frequency:     Barriers to D/C:            Co-evaluation              AM-PAC PT "6 Clicks" Daily Activity     Outcome Measure Help from another person eating meals?: None Help from another person taking care of personal grooming?: None Help from another person toileting, which includes using toliet, bedpan, or urinal?: None Help from another person bathing (including washing, rinsing, drying)?: None Help from another person to put on and taking off regular upper body clothing?: None Help from another person to put on and taking off regular lower body clothing?: None 6 Click Score: 24   End of Session    Activity Tolerance: Patient tolerated treatment well Patient left: in chair;with call bell/phone within reach  OT Visit Diagnosis: Unsteadiness on  feet (R26.81);Low vision, both eyes (H54.2)                Time: 0109-3235 OT Time Calculation (min): 13 min Charges:  OT General Charges $OT Visit: 1 Visit OT Evaluation $OT Eval Low Complexity: 1 Low G-Codes:     Norman Herrlich, MS OTR/L  Pager: [place] A Ayline Dingus 10/02/2016, 8:33 AM

## 2016-10-02 NOTE — Evaluation (Signed)
Physical Therapy Evaluation/Discharge Patient Details Name: Justin [name] MRN: [id number] DOB: [birth year] Today's Date: 10/02/2016   History of Present Illness  Pt is a 46 y.o. male who presented to [facility] with L sided tingling and numbness as well as double vision. He was subsequently transferred to [facility] as a code stroke and recieved IV tPA at 1802. PMH significant for hypertension, hyperlipidemia, migraines, obstructive sleep apnea, and cocaine abuse. Imaging negative for acute infarct.  Clinical Impression  Pt is independent in all his mobility (at least to his baseline with h/o peroneal nerve injury).  Vision deficits do not seem to affect his mobility and balance and have been addressed by OT.  PT to sign off.  Pt is safe to d/c home and does not need anymore acute PT or f/u PT at discharge.      Follow Up Recommendations No PT follow up    Equipment Recommendations  None recommended by PT    Recommendations for Other Services   NA    Precautions / Restrictions Precautions Precautions: None      Mobility  Bed Mobility Overal bed mobility: Independent                Transfers Overall transfer level: Independent                  Ambulation/Gait Ambulation/Gait assistance: Independent Ambulation Distance (Feet): 300 Feet Assistive device: None Gait Pattern/deviations: Steppage (foot slap left)   Gait velocity interpretation: at or above normal speed for age/gender General Gait Details: Pt with steppage gait pattern at baseline compensating for his left foot weakness, independent with gait.   Stairs Stairs: Yes Stairs assistance: Independent Stair Management: No rails;Alternating pattern;Forwards Number of Stairs: 5        Modified Rankin (Stroke Patients Only) Modified Rankin (Stroke Patients Only) Pre-Morbid Rankin Score: No symptoms Modified Rankin: No symptoms           Pertinent Vitals/Pain Pain  Assessment: No/denies pain    Home Living Family/patient expects to be discharged to:: Private residence Living Arrangements: Spouse/significant other Available Help at Discharge: Family;Available 24 hours/day Type of Home: House Home Access: Stairs to enter Entrance Stairs-Rails: None Entrance Stairs-Number of Steps: 3 Home Layout: One level Home Equipment: None      Prior Function Level of Independence: Independent         Comments: Working and drives long distances for work.  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  He has had several corrective surgeries.  Left lower leg at baseline with peroneal nerve distribution numbness and DF weakness (1/5).  PF 4/5, knee extension 5/5, hip flexion 5/5 LLE Sensation: decreased light touch (at baseline)    Cervical / Trunk Assessment Cervical / Trunk Assessment: Normal  Communication   Communication: No difficulties  Cognition Arousal/Alertness: Awake/alert Behavior During Therapy: WFL for tasks assessed/performed Overall Cognitive Status: Within Functional Limits for tasks assessed  Assessment/Plan    PT Assessment Patent does not need any further PT services         PT Goals (Current goals can be found in the Care Plan section)  Acute Rehab PT Goals PT Goal Formulation: All assessment and education complete, DC therapy               AM-PAC PT "6 Clicks" Daily Activity  Outcome Measure Difficulty turning over in bed (including adjusting bedclothes, sheets and  blankets)?: None Difficulty moving from lying on back to sitting on the side of the bed? : None Difficulty sitting down on and standing up from a chair with arms (e.g., wheelchair, bedside commode, etc,.)?: None Help needed moving to and from a bed to chair (including a wheelchair)?: None Help needed walking in hospital room?: None Help needed climbing 3-5 steps with a railing? : None 6 Click Score: 24    End of Session Equipment Utilized During Treatment: Gait belt Activity Tolerance: Patient tolerated treatment well Patient left: in bed;with call bell/phone within reach Nurse Communication: Mobility status PT Visit Diagnosis: Difficulty in walking, not elsewhere classified (R26.2);Muscle weakness (generalized) (M62.81)    Time: 1343-1400 PT Time Calculation (min) (ACUTE ONLY): 17 min   Charges:        Wells Guiles B. Mettie Roylance, PT, DPT [phone]   PT Evaluation $PT Eval Moderate Complexity: 1 Mod         10/02/2016, 5:44 PM

## 2016-10-02 NOTE — Plan of Care (Signed)
Problem: Safety: Goal: Ability to remain free from injury will improve Outcome: Completed/Met Date Met: 10/02/16 Patient with prior left foot drop, safely demonstrates walking in room to be signed off as independent.

## 2016-10-02 NOTE — Care Management Note (Signed)
Case Management Note  Patient Details  Name: Laurent Cargile MRN: [id number] Date of Birth: 1970/04/24  Subjective/Objective:                    Action/Plan: Pt discharging home with self care. Pt has not insurance listed. Pt states he has insurance through his employer. Pt without a card at the facility. Pt to call admissions with the information after d/c.  Pt states his wife has a card to assist with cost of medications. CM did provide him a coupon for the lipitor in case needed when picks up medications.  Wife to provide transportation home.  Expected Discharge Date:  10/02/16               Expected Discharge Plan:  Home/Self Care  In-House Referral:     Discharge planning Services  CM Consult, Medication Assistance  Post Acute Care Choice:    Choice offered to:     DME Arranged:    DME Agency:     HH Arranged:    HH Agency:     Status of Service:  Completed, signed off  If discussed at [employer] of Stay Meetings, dates discussed:    Additional Comments:  Pollie Friar, RN 10/02/2016, 4:15 PM

## 2016-10-02 NOTE — Progress Notes (Signed)
Patient IV removed. Discharge instructions given, verbally understood. Questions answered. Patient taken to car via w/c.

## 2016-10-02 NOTE — Discharge Summary (Signed)
Stroke Discharge Summary  Patient ID: Justin [name]   MRN: [id number]      DOB: 01-31-1971  Date of Admission: 09/30/2016 Date of Discharge: 10/02/2016  Attending Physician:  Rosalin Hawking, MD, Stroke MD Consultant(s):     Patient's PCP:  Burnis Medin, MD  DISCHARGE DIAGNOSIS: Active Problems:   Left sided numbness - no acute stroke, TIA less likely, may be reaction to cocaine or anxiety   HTN   HLD   Smoker   OSA   Cocaine abuse   Past Medical History:  Diagnosis Date  . Arrhythmia   . Arthritis    Both knees  . Chicken pox    As a child  . High cholesterol   . Hypertension    on meds since 1998   . Left ventricular hypertrophy    card work up [facility] QRS  . Migraines    Triggered by stress/tension  . OSA (obstructive sleep apnea)    Past Surgical History:  Procedure Laterality Date  . KNEE ARTHROPLASTY Left    left 2013 ligaments     Allergies as of 10/02/2016      Reactions   Penicillins Anaphylaxis   Pt can tolerate amoxicillin Childhood allergy- details unknown      Medication List    TAKE these medications   aspirin 81 MG EC tablet Take 1 tablet (81 mg total) by mouth daily.   atorvastatin 20 MG tablet Commonly known as:  LIPITOR Take 1 tablet (20 mg total) by mouth daily at 6 PM.   gatifloxacin 0.5 % Soln Commonly known as:  ZYMAXID Place 1 drop into the left eye 4 (four) times daily.   hydrochlorothiazide 25 MG tablet Commonly known as:  HYDRODIURIL TAKE 1 TABLET (25 MG TOTAL) BY MOUTH DAILY.   lisinopril 10 MG tablet Commonly known as:  PRINIVIL,ZESTRIL TAKE 1 TABLET (10 MG TOTAL) BY MOUTH DAILY.            Discharge Care Instructions        Start     Ordered   10/03/16 0000  aspirin EC 81 MG EC tablet  Daily     10/02/16 1512   10/02/16 0000  atorvastatin (LIPITOR) 20 MG tablet  Daily-1800     10/02/16 1512   10/02/16 0000  Ambulatory referral to Neurology    Comments:  An appointment is requested in  approximately: 6 - 8 weeks   10/02/16 1512      LABORATORY STUDIES CBC    Component Value Date/Time   WBC 13.3 (H) 09/30/2016 1634   RBC 5.37 09/30/2016 1634   HGB 16.0 09/30/2016 1646   HCT 47.0 09/30/2016 1646   PLT 268 09/30/2016 1634   MCV 84.9 09/30/2016 1634   MCH 29.4 09/30/2016 1634   MCHC 34.6 09/30/2016 1634   RDW 14.7 09/30/2016 1634   LYMPHSABS 4.2 (H) 09/30/2016 1634   MONOABS 1.4 (H) 09/30/2016 1634   EOSABS 0.2 09/30/2016 1634   BASOSABS 0.0 09/30/2016 1634   CMP    Component Value Date/Time   NA 142 09/30/2016 1646   K 3.9 09/30/2016 1646   CL 105 09/30/2016 1646   CO2 23 09/30/2016 1634   GLUCOSE 98 09/30/2016 1646   BUN 21 (H) 09/30/2016 1646   CREATININE 1.20 09/30/2016 1646   CALCIUM 9.1 09/30/2016 1634   PROT 7.7 09/30/2016 1634   ALBUMIN 4.2 09/30/2016 1634   AST 24 09/30/2016 1634   ALT 23  09/30/2016 1634   ALKPHOS 57 09/30/2016 1634   BILITOT 0.5 09/30/2016 1634   GFRNONAA >60 09/30/2016 1634   GFRAA >60 09/30/2016 1634   COAGS Lab Results  Component Value Date   INR 1.05 09/30/2016   Lipid Panel    Component Value Date/Time   CHOL 160 10/01/2016 0202   TRIG 72 10/01/2016 0202   HDL 41 10/01/2016 0202   CHOLHDL 3.9 10/01/2016 0202   VLDL 14 10/01/2016 0202   LDLCALC 105 (H) 10/01/2016 0202   HgbA1C  Lab Results  Component Value Date   HGBA1C 5.6 10/01/2016   Urinalysis    Component Value Date/Time   COLORURINE YELLOW 11/14/2013 2230   APPEARANCEUR CLEAR 11/14/2013 2230   LABSPEC 1.021 11/14/2013 2230   PHURINE 5.5 11/14/2013 2230   GLUCOSEU NEGATIVE 11/14/2013 2230   HGBUR NEGATIVE 11/14/2013 2230   BILIRUBINUR n 11/30/2013 1614   KETONESUR NEGATIVE 11/14/2013 2230   PROTEINUR n 11/30/2013 1614   PROTEINUR NEGATIVE 11/14/2013 2230   UROBILINOGEN 0.2 11/30/2013 1614   UROBILINOGEN 0.2 11/14/2013 2230   NITRITE n 11/30/2013 1614   NITRITE NEGATIVE 11/14/2013 2230   LEUKOCYTESUR Negative 11/30/2013 1614   Urine  Drug Screen     Component Value Date/Time   LABOPIA NONE DETECTED 10/01/2016 1604   COCAINSCRNUR POSITIVE (A) 10/01/2016 1604   LABBENZ NONE DETECTED 10/01/2016 1604   AMPHETMU NONE DETECTED 10/01/2016 1604   THCU NONE DETECTED 10/01/2016 1604   LABBARB NONE DETECTED 10/01/2016 1604    Alcohol Level    Component Value Date/Time   ETH <5 09/30/2016 2030     SIGNIFICANT DIAGNOSTIC STUDIES I have personally reviewed the radiological images below and agree with the radiology interpretations.  Ct Angio Head and neck and CT perfusion W Or Wo Contrast 09/30/2016 IMPRESSION: 1. Negative for emergent large vessel occlusion. No atherosclerosis or stenosis identified in the head or neck. 2. Mild motion artifact. CT Perfusion does not detect core infarct, but perfusion analysis suggests prolonged T-max in bilateral hemispheric white matter which is felt to be artifact.  Ct Head Wo Contrast 09/30/2016 IMPRESSION: Normal CT of the brain.  Ct Head Code Stroke Wo Contrast 09/30/2016   IMPRESSION: 1. Normal noncontrast CT appearance of the brain. 2. ASPECTS is 10.   MRI Brain Wo Contrast 10/01/2016 No acute intracranial abnormality identified. Unremarkable MRI of the brain.  TTE - - Left ventricle: The cavity size was normal. There was mild concentric hypertrophy. Systolic function was normal. The estimated ejection fraction was in the range of 60% to 65%. Wall motion was normal; there were no regional wall motion abnormalities. Left ventricular diastolic function parameters were normal. - Pulmonary arteries: Systolic pressure could not be accurately estimated.     HISTORY OF PRESENT ILLNESS Justin [name] is a 46 y.o. male past medical history of hypertension, hyperlipidemia, migraines, obstructive sleep apnea, cocaine abuse, who was in his usual state of health until about 2:15 PM, when he started noticing some left-sided tingling and numbness. He was on his way from  [facility] to [place], stopped at a truck stop and noticed that his left arm and leg feel weak as well. He looked at himself in the mirror and saw left facial droop. He came in to [facility] where he was evaluated and a code stroke was activated. The patient was sent to [facility] emergency room as a code stroke. He complained of double vision, left arm weakness, left leg weakness and left-sided tingling and numbness at  the time of arrival when he was evaluated. On my initial evaluation, his NIH stroke scale was 2 (see below) He denied using any cocaine today but endorses using cocaine on Friday. Denied using any alcohol. Denied any preceding illnesses. Next line denies fevers or chills. Denies nausea or vomiting at this time. Denied abdominal pain. Denied easy bleeding or bruising. Denied any recent surgeries. Denied any blood thinning medications currently he is on. He has had a recent loss of a family member because of which she has been very upset. His exam was not perfectly consistent, but given the laterality of symptoms as well as eye findings as documented below, I obtained a CT a and perfusion studies and then decided to give him IV TPA. He got the bolus at 1802 hours.  LKW: 2:15 PM tpa given?: Yes Premorbid modified Rankin scale (mRS):  0 Delays in the process: -Transfer from [employer] -Inconsistent exam. -Obtaining CTA and perfusion   ROS: A 14 point ROS was performed and is negative except as noted in the HPI.    HOSPITAL COURSE Justin [name] is a 45 y.o. male with history of hypertension, hyperlipidemia, OSA, cocaine abuse, current smoker, migraine presenting with left sided numbness tingling, left facial droop. He did receive IV t-PA at ER.   Left sided numbness - no stroke, less likely TIA, may be associated with cocaine use or anxiety  Resultant patchy left arm and leg decreased sensation    CT head no acute abnormality  MRI head no acute  abnormality  CTA head and neck and CTP unremarkable  2D Echo  EF 60-65%  UDS positive for cocaine  LDL 105  HgbA1c 5.6  SCDs for VTE prophylaxis  Diet heart healthy/carb modified Room service appropriate? Yes; Fluid consistency: Thin  No antithrombotic prior to admission, now on No antithrombotic within 24 hours of TPA  Patient counseled to be compliant with his antithrombotic medications  Ongoing aggressive stroke risk factor management  Therapy recommendations: None  Disposition: Discharge to home  Cocaine abuse  Patient admitted cocaine use  UDS showed positive cocaine  Cocaine cessation counseling provided  Patient is willing to quit  Tobacco abuse  Current smoker  Smoking cessation counseling provided  Pt is willing to quit  Hypertension  Stable  Permissive hypertension (OK if <180/105) but gradually normalize in 5-7 days  Long-term BP goal normotensive  Hyperlipidemia  Home meds:  None  LDL 105, goal < 70  Add Lipitor 20  Continue statin at discharge  Other Stroke Risk Factors  Advanced age  ETOH use, advised to drink no more than 1-2 drink(s) a day  Morbid obesity, Body mass index is 41.06 kg/m., recommend weight loss, diet and exercise as appropriate   Migraines  Obstructive sleep apnea, not on CPAP at home  Other Active Problems  None   DISCHARGE EXAM Blood pressure 134/85, pulse 71, temperature 98.7 F (37.1 C), temperature source Oral, resp. rate 20, height 5\' 8"  (1.727 m), weight 122.5 kg (270 lb 1 oz), SpO2 99 %. General - Well nourished, well developed, in no apparent distress.  Ophthalmologic - Fundi not visualized due to small pupils.  Cardiovascular - Regular rate and rhythm.  Mental Status -  Level of arousal and orientation to time, place, and person were intact. Language including expression, naming, repetition, comprehension was assessed and found intact. Fund of Knowledge was assessed and was  intact.  Cranial Nerves II - XII - II - Visual field intact OU. III,  IV, VI - Extraocular movements intact. V - Facial sensation decreased on the left. VII - Facial movement intact bilaterally. VIII - Hearing & vestibular intact bilaterally. X - Palate elevates symmetrically. XI - Chin turning & shoulder shrug intact bilaterally. XII - Tongue protrusion intact.  Motor Strength - The patient's strength was normal in all extremities except chronic left foot drop with 1/5 DF and pronator drift was absent.  Bulk was normal and fasciculations were absent.   Motor Tone - Muscle tone was assessed at the neck and appendages and was normal.  Reflexes - The patient's reflexes were 1+ in all extremities and he had no pathological reflexes.  Sensory - Light touch, temperature/pinprick were assessed and were decreased on the left, 90% of the right.    Coordination - The patient had normal movements in the hands with no ataxia or dysmetria.  Tremor was absent.  Gait and Station - deferred  Discharge Diet   Diet heart healthy/carb modified Room service appropriate? Yes; Fluid consistency: Thin liquids  DISCHARGE PLAN  Disposition: Discharge to home  aspirin 81 mg daily for secondary stroke prevention.  Ongoing risk factor control by Primary Care Physician at time of discharge  Follow-up Panosh, Standley Brooking, MD in 2 weeks.  No neuro follow up needed at this time.  35 minutes were spent preparing discharge.  Rosalin Hawking, MD PhD Stroke Neurology 10/02/2016 9:28 PM

## 2016-10-02 NOTE — Care Management Note (Signed)
Case Management Note  Patient Details  Name: Markee Matera MRN: [id number] Date of Birth: 1970-04-11  Subjective/Objective:    Pt in to r/o CVA. Pt received TPA. He is from home with his spouse.                 Action/Plan: Awaiting PT recommendations. No f/u per OT. CM following for d/c needs, physician orders.   Expected Discharge Date:                  Expected Discharge Plan:  Home/Self Care  In-House Referral:     Discharge planning Services     Post Acute Care Choice:    Choice offered to:     DME Arranged:    DME Agency:     HH Arranged:    HH Agency:     Status of Service:  In process, will continue to follow  If discussed at Long Length of Stay Meetings, dates discussed:    Additional Comments:  Pollie Friar, RN 10/02/2016, 10:42 AM

## 2016-10-04 ENCOUNTER — Telehealth: Payer: Self-pay

## 2016-10-04 NOTE — Telephone Encounter (Signed)
Date of Admission: 09/30/2016 Date of Discharge: 10/02/2016 To: home  Attending Physician:  Rosalin Hawking, MD, Stroke MD Consultant(s):     Patient's PCP:  Burnis Medin, MD  DISCHARGE DIAGNOSIS: Active Problems:   Left sided numbness - no acute stroke, TIA less likely, may be reaction to cocaine or anxiety   HTN   HLD   Smoker   OSA   Cocaine abuse   Spoke with pt and he states that he is doing well. He has no questions or concerns at this time.   Appt scheduled with Dr. Regis Bill 10/09/16, pt aware.  Transition Care Management Follow-up Telephone Call   Date discharged? 10/02/16   How have you been since you were released from the hospital? good   Do you understand why you were in the hospital? yes   Do you understand the discharge instructions? yes   Where were you discharged to? home   Items Reviewed:  Medications reviewed: yes  Allergies reviewed: yes  Dietary changes reviewed: yes  Referrals reviewed: yes   Functional Questionnaire:   Activities of Daily Living (ADLs):   He states they are independent in the following: ambulation, bathing and hygiene, feeding, continence, grooming, toileting and dressing States they require assistance with the following: none   Any transportation issues/concerns?: no   Any patient concerns? no   Confirmed importance and date/time of follow-up visits scheduled yes  Provider Appointment booked with Dr. Regis Bill 10/09/16  Confirmed with patient if condition begins to worsen call PCP or go to the ER.  Patient was given the office number and encouraged to call back with question or concerns.  : yes

## 2016-10-05 NOTE — Progress Notes (Deleted)
No chief complaint on file.   HPI: Justin [name] 46 y.o. come in for  Fu hopst last seem by me almost 3 years ago and since then  onlyu ed visigts in eher and gi  Visits   In regard to his ongoing problems  No  continuity of care  Documented  His ht meds not refilled since 2016    OSA :  ROS: See pertinent positives and negatives per HPI.  Past Medical History:  Diagnosis Date  . Arrhythmia   . Arthritis    Both knees  . Chicken pox    As a child  . High cholesterol   . Hypertension    on meds since 1998   . Left ventricular hypertrophy    card work up [facility] ? inc QRS  . Migraines    Triggered by stress/tension  . OSA (obstructive sleep apnea)     Family History  Problem Relation Age of Onset  . Hypertension Mother   . Other Father        murdered   . Breast cancer Maternal Grandmother   . Breast cancer Maternal Aunt        x 3  . Colon cancer Neg Hx     Social History   Social History  . Marital status: Married    Spouse name: N/A  . Number of children: N/A  . Years of education: N/A   Occupational History  . [id number] [employer]    maintenance tech   Social History Main Topics  . Smoking status: Current Every Day Smoker    Packs/day: 0.25    Years: 30.00    Types: Cigarettes  . Smokeless tobacco: Never Used     Comment: tobacco info given 02/24/14  . Alcohol use 0.0 oz/week     Comment: rarely  . Drug use: No  . Sexual activity: Not on file   Other Topics Concern  . Not on file   Social History Narrative   8 hours of sleep per night   Lives with his girlfriend and step-daughter.   College/Maintenance Tech/Works full time   [employer]    physically active  Job  Sometimes 33- 40    Tobacco age 1  stopped or a month .   etoh  less than 76 per week.    Sodas  And sugar sodas  7-8 per day and then came down  To 1 day.    orig from [place] takes vitamins                   Outpatient  Medications Prior to Visit  Medication Sig Dispense Refill  . aspirin EC 81 MG EC tablet Take 1 tablet (81 mg total) by mouth daily. 30 tablet 0  . atorvastatin (LIPITOR) 20 MG tablet Take 1 tablet (20 mg total) by mouth daily at 6 PM. 30 tablet 0  . gatifloxacin (ZYMAXID) 0.5 % SOLN Place 1 drop into the left eye 4 (four) times daily. (Patient not taking: Reported on 10/01/2016) 1 Bottle 0  . hydrochlorothiazide (HYDRODIURIL) 25 MG tablet TAKE 1 TABLET (25 MG TOTAL) BY MOUTH DAILY. (Patient not taking: Reported on 10/01/2016) 30 tablet 5  . lisinopril (PRINIVIL,ZESTRIL) 10 MG tablet TAKE 1 TABLET (10 MG TOTAL) BY MOUTH DAILY. (Patient not taking: Reported on 10/01/2016) 30 tablet 5   No facility-administered medications prior to visit.      EXAM:  There were no  vitals taken for this visit.  There is no height or weight on file to calculate BMI.  GENERAL: vitals reviewed and listed above, alert, oriented, appears well hydrated and in no acute distress HEENT: atraumatic, conjunctiva  clear, no obvious abnormalities on inspection of external nose and ears OP : no lesion edema or exudate  NECK: no obvious masses on inspection palpation  LUNGS: clear to auscultation bilaterally, no wheezes, rales or rhonchi, good air movement CV: HRRR, no clubbing cyanosis or  peripheral edema nl cap refill  MS: moves all extremities without noticeable focal  abnormality PSYCH: pleasant and cooperative, no obvious depression or anxiety Lab Results  Component Value Date   WBC 13.3 (H) 09/30/2016   HGB 16.0 09/30/2016   HCT 47.0 09/30/2016   PLT 268 09/30/2016   GLUCOSE 98 09/30/2016   CHOL 160 10/01/2016   TRIG 72 10/01/2016   HDL 41 10/01/2016   LDLCALC 105 (H) 10/01/2016   ALT 23 09/30/2016   AST 24 09/30/2016   NA 142 09/30/2016   K 3.9 09/30/2016   CL 105 09/30/2016   CREATININE 1.20 09/30/2016   BUN 21 (H) 09/30/2016   CO2 23 09/30/2016   INR 1.05 09/30/2016   HGBA1C 5.6 10/01/2016   BP  Readings from Last 3 Encounters:  10/02/16 134/85  05/25/16 121/82  03/31/16 119/89   Wt Readings from Last 3 Encounters:  09/30/16 270 lb 1 oz (122.5 kg)  05/24/16 293 lb (132.9 kg)  03/31/16 293 lb (132.9 kg)     ASSESSMENT AND PLAN:  Discussed the following assessment and plan:  Acute ischemic stroke (HCC) - TIA:  Possible right thalamic/IC infarct, secondary to small vessel disease source. Risk factor including HTN, HLD, cocaine use, OSA, smoking  -Patient advised to return or notify health care team  if  new concerns arise.  There are no Patient Instructions on file for this visit.   Standley Brooking. Panosh M.D.   HOSPITAL COURSE Justin Roysteris a 46 y.o.malewith history of hypertension, hyperlipidemia, OSA, cocaine abuse, current smoker, migrainepresenting with left sided numbness tingling, left facial droop. Hedidreceive IV t-PAat ER.   Left sided numbness - no stroke, less likely TIA, may be associated with cocaine use or anxiety  Resultant patchy left arm and leg decreased sensation    CT head no acute abnormality  MRIhead no acute abnormality  CTA head and neck and CTP unremarkable  2D EchoEF 60-65%  UDS positive for cocaine  LDL105  HgbA1c5.6  SCDs for VTE prophylaxis  Diet heart healthy/carb modified Room service appropriate? Yes; Fluid consistency: Thin  No antithromboticprior to admission, now on No antithromboticwithin 24 hours of TPA  Patient counseled to be compliant with hisantithrombotic medications  Ongoing aggressive stroke risk factor management  Therapy recommendations: None  Disposition: Discharge to home  Cocaine abuse  Patient admitted cocaine use  UDS showed positive cocaine  Cocaine cessation counseling provided  Patient is willing to quit  Tobacco abuse  Current smoker  Smoking cessation counseling provided  Pt is willing to quit  Hypertension  Stable  Permissive hypertension (OK if  <180/105) but gradually normalize in 5-7 days  Long-term BP goal normotensive  Hyperlipidemia  Home meds: None  LDL 105, goal < 70  Add Lipitor 20  Continue statin at discharge  Other Stroke Risk Factors  Advanced age  ETOH use, advised to drink no more than 1-2 drink(s) a day  Morbid obesity, Body mass index is 41.06 kg/m., recommend weight loss, diet and  exercise as appropriate   Migraines  Obstructive sleep apnea, not on CPAP at home  Other Active Problems  None   DISCHARGE EXAM Blood pressure 134/85, pulse 71, temperature 98.7 F (37.1 C), temperature source Oral, resp. rate 20, height 5\' 8"  (1.727 m), weight 122.5 kg (270 lb 1 oz), SpO2 99 %. General- Well nourished, well developed, in no apparent distress.  Ophthalmologic- Fundi not visualized due to small pupils.  Cardiovascular - Regular rate and rhythm.  Mental Status-  Level of arousal and orientation to time, place, and person were intact. Language including expression, naming, repetition, comprehension was assessed and found intact. Fund of Knowledge was assessed and was intact.  Cranial Nerves II - XII- II - Visual fieldintact OU. III, IV, VI - Extraocular movements intact. V - Facial sensation decreased on the left. VII - Facial movement intact bilaterally. VIII - Hearing & vestibular intact bilaterally. X - Palate elevates symmetrically. XI - Chin turning & shoulder shrug intact bilaterally. XII - Tongue protrusion intact.  Motor Strength - The patient's strength was normal in all extremitiesexcept chronic left foot drop with 1/5 DFand pronator drift was absent. Bulk was normal and fasciculations were absent.  Motor Tone- Muscle tone was assessed at the neck and appendages and was normal.  Reflexes- The patient's reflexes were 1+in all extremities and hehad no pathological reflexes.  Sensory- Light touch, temperature/pinprick were assessed and were decreased on the  left, 90% of the right.  Coordination- The patient had normal movements in the hands with no ataxia or dysmetria. Tremor was absent.  Gait and Station- deferred  Discharge Diet   Diet heart healthy/carb modified Room service appropriate? Yes; Fluid consistency: Thin liquids  DISCHARGE PLAN  Disposition: Discharge to home  aspirin 81 mg daily for secondary stroke prevention.  Ongoing risk factor control by Primary Care Physician at time of discharge  Follow-up Panosh, Standley Brooking, MD in 2 weeks.  No neuro follow up needed at this time.  35 minutes were spent preparing discharge.  Rosalin Hawking, MD PhD Stroke Neurology

## 2016-10-09 ENCOUNTER — Ambulatory Visit: Payer: Self-pay | Admitting: Internal Medicine

## 2016-10-09 DIAGNOSIS — Z0289 Encounter for other administrative examinations: Secondary | ICD-10-CM

## 2016-10-26 ENCOUNTER — Encounter: Payer: Self-pay | Admitting: Internal Medicine

## 2017-01-09 ENCOUNTER — Other Ambulatory Visit: Payer: Self-pay

## 2017-01-10 ENCOUNTER — Other Ambulatory Visit: Payer: Self-pay

## 2017-01-15 ENCOUNTER — Other Ambulatory Visit: Payer: Self-pay

## 2017-03-25 NOTE — Progress Notes (Signed)
Chief Complaint  Patient presents with  . Establish Care    last seen 2015. Pt here today for DOT work up. States that his BP has been elevated and he needs to get it downt o 140/90 or lower.     HPI: Patient  Justin [name]  47 y.o. comes in today for \\reestablish  care    haven't seen in  Over 3 years    Uncertain care in the interim .  Needs     bp lowering  Medication for 2 yeatrs.   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  He has a 3 months temporary. He likes his job is physically active may get health insurance through his job in another month. He used to be on age she sees the hand he states did well with blood pressure control would like to go back on that particular one is he has been off medication for 2 years. He stated that Maxide when used had a problem with back pain or some other difficulty.  Was seen hosp in 2018 for tia ischemic stroke and pos screen for cocaine at the time he states that it was the time of the family member's funeral and he did get exposed but he does not do RD.  The final diagnoses he states was a complicated migraine Remote history when he was younger of a significant head injury and since that time it had some recurrent migraines but not that severe.  He takes occasional Excedrin Migraine last when 2 weeks ago.  No stroke  like symptoms.   Last seen by this provider in 2015   Health Maintenance  Topic Date Due  . PNEUMOCOCCAL POLYSACCHARIDE VACCINE (1) 12/22/1971  . FOOT EXAM  12/22/1979  . INFLUENZA VACCINE  09/05/2016  . OPHTHALMOLOGY EXAM  03/31/2017  . HEMOGLOBIN A1C  04/03/2017  . COLONOSCOPY  03/03/2019  . TETANUS/TDAP  09/24/2023  . 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 GI /GU: No adominal pain, vomiting, change in bowel habits. No blood in the stool. No significant GU symptoms. SKIN/HEME: ,no acute skin rashes suspicious lesions or bleeding. No lymphadenopathy, nodules, masses.  NEURO/ PSYCH:  No neurologic signs such as weakness numbness. No depression anxiety. IMM/ Allergy: No unusual infections.  Allergy .   REST of 12 system review negative except as per HPI   Past Medical History:  Diagnosis Date  . Arrhythmia   . Arthritis    Both knees  . Chicken pox    As a child  . High cholesterol   . Hypertension    on meds since 1998   . Left ventricular hypertrophy    card work up [facility] QRS  . Migraines    Triggered by stress/tension  . OSA (obstructive sleep apnea)     Past Surgical History:  Procedure Laterality Date  . KNEE ARTHROPLASTY Left    left  2013 ligaments     Family History  Problem Relation Age of Onset  . Hypertension Mother   . Other Father        murdered   . Breast cancer Maternal Grandmother   . Breast cancer Maternal Aunt        x 3  . Colon cancer Neg Hx     Social History   Socioeconomic History  . Marital status: Married    Spouse name: None  . Number of children: None  . Years of education: None  . Highest education level: None  Social Needs  . Financial resource strain: None  . Food insecurity - worry: None  . Food insecurity - inability: None  . Transportation needs - medical: None  . Transportation needs - non-medical: None  Occupational History  . Occupation: [id number]    Employer: [employer]    Comment: maintenance tech  Tobacco Use  . Smoking status: Current Every Day Smoker    Packs/day: 0.25    Years: 30.00     Pack years: 7.50    Types: Cigarettes  . Smokeless tobacco: Never Used  . Tobacco comment: tobacco info given 02/24/14  Substance and Sexual Activity  . Alcohol use: Yes    Alcohol/week: 0.0 oz    Comment: rarely  . Drug use: No    Comment: UDS 09/2016, no "current use" noted  . Sexual activity: None  Other Topics Concern  . None  Social History Narrative   8 hours of sleep per night   Lives with his girlfriend and step-daughter.   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   etoh  less than 58 per week.    Sodas  And sugar sodas  7-8 per day and then came down  To 1 day.    orig from [place] takes vitamins                Outpatient Medications Prior to Visit  Medication Sig Dispense Refill  . aspirin EC 81 MG EC tablet Take 1 tablet (81 mg total) by mouth daily. 30 tablet 0  . hydrochlorothiazide (HYDRODIURIL) 25 MG tablet TAKE 1 TABLET (25 MG TOTAL) BY MOUTH DAILY. 30 tablet 5  . atorvastatin (LIPITOR) 20 MG tablet Take 1 tablet (20 mg total) by mouth daily at 6 PM. (Patient not taking: Reported on 03/26/2017) 30 tablet 0  . gatifloxacin (ZYMAXID) 0.5 % SOLN Place 1 drop into the left eye 4 (four) times daily. (Patient not taking: Reported on 03/26/2017) 1 Bottle 0  . lisinopril (PRINIVIL,ZESTRIL) 10 MG tablet TAKE 1 TABLET (10 MG TOTAL) BY MOUTH DAILY. (Patient not taking: Reported on 03/26/2017) 30 tablet 5   No facility-administered medications prior to visit.      EXAM:  BP (!) 140/96 (BP Location: Left Arm, Patient Position: Sitting, Cuff Size: Large)   Pulse 73   Temp 98.4 F (36.9 C) (Oral)   Wt 291 lb 3.2 oz (132.1 kg)   BMI 44.28 kg/m   Body mass index is 44.28 kg/m. Wt Readings from Last 3 Encounters:  03/26/17 291 lb 3.2 oz (132.1 kg)  09/30/16 270 lb 1 oz (122.5 kg)  05/24/16 293 lb  (132.9 kg)    Physical Exam:  Vital signs reviewed [id number] is a well-developed well-nourished alert cooperative    who appearsr stated age in no acute distress.  HEENT: normocephalic atraumatic , Eyes: PERRL EOM's full, conjunctiva clear, Nares: paten,t no deformity discharge or tenderness., Ears: no deformity EAC's clear TMs with normal landmarks. Mouth: clear OP, no lesions, edema.  Moist mucous membranes. Dentition in adequate repair. NECK: supple without masses, thyromegaly or bruits. CHEST/PULM:  Clear to auscultation and percussion breath sounds equal no wheeze , rales or rhonchi. No chest wall deformities or tenderness. Breast: normal by inspection . No dimpling, discharge, masses, tenderness or discharge . CV: PMI is nondisplaced, S1 S2 no gallops, murmurs, rubs. Peripheral pulses are full without delay.No JVD .  ABDOMEN: Bowel sounds normal nontender  No guard or rebound, no hepato splenomegal no CVA tenderness.  No hernia. Extremtities:  No clubbing cyanosis or edema, no acute joint swelling or redness no focal atrophy NEURO:  Oriented x3, cranial nerves 3-12 appear to be intact, no obvious focal weakness,gait within normal limits no abnormal reflexes or asymmetrical SKIN: No acute rashes normal turgor, color, no bruising or petechiae. PSYCH: Oriented, good eye contact, no obvious depression anxiety, cognition and judgment appear normal. LN: no cervical axillary inguinal adenopathy  Lab Results  Component Value Date   WBC 13.3 (H) 09/30/2016   HGB 16.0 09/30/2016   HCT 47.0 09/30/2016   PLT 268 09/30/2016   GLUCOSE 98 09/30/2016   CHOL 160 10/01/2016   TRIG 72 10/01/2016   HDL 41 10/01/2016   LDLCALC 105 (H) 10/01/2016   ALT 23 09/30/2016   AST 24 09/30/2016   NA 142 09/30/2016   K 3.9 09/30/2016   CL 105 09/30/2016   CREATININE 1.20 09/30/2016   BUN 21 (H) 09/30/2016   CO2 23 09/30/2016   INR 1.05 09/30/2016   HGBA1C 5.6 10/01/2016    BP Readings from Last 3  Encounters:  03/26/17 (!) 140/96  10/02/16 134/85  05/25/16 121/82    Wt Readings from Last 3 Encounters:  03/26/17 291 lb 3.2 oz (132.1 kg)  09/30/16 270 lb 1 oz (122.5 kg)  05/24/16 293 lb (132.9 kg)     ASSESSMENT AND PLAN:  Discussed the following assessment and plan:  Essential hypertension  Encounter to establish care  Medication management  Tobacco use [patient says complicated migraine   Will reviewed record  Poss small tia vs other got tpa    Discussed interventions such as minimizing sugar drinks eventually stopping tobacco blood pressure control.  He states he does not have sleep apnea but it is in his list is an old problem.  Will address as we go along. He states he had blood work done at his work checkup but we do not have those results will need chemistry panel done at follow-up. We will refill HCTZ based on his history that it worked well for blood pressure control in the past but we did discuss other interventions. Patient Care Team: Jennetta Flood, Standley Brooking, MD as PCP - General (Internal Medicine) Patient Instructions  hctz  25 mg is inexpensive to get .     Begin medication make sure  Low sodium diet  dash   Plan ROV .   In  2-3 months or as needed.    lab work would be due  In 1-2 months      [place] stands for "Dietary Approaches to Stop Hypertension." The DASH eating plan is a healthy eating plan that has been shown to  reduce high blood pressure (hypertension). It may also reduce your risk for type 2 diabetes, heart disease, and stroke. The DASH eating plan may also help with weight loss. What are tips for following this plan? General guidelines  Avoid eating more than 2,300 mg (milligrams) of salt (sodium) a day. If you have hypertension, you may need to reduce your sodium intake to 1,500 mg a day.  Limit alcohol intake to no more than 1 drink a day for nonpregnant women and 2 drinks a day for men. One drink equals 12 oz of beer, 5 oz of  wine, or 1 oz of hard liquor.  Work with your health care provider to maintain a healthy body weight or to lose weight. Ask what an ideal weight is for you.  Get at least 30 minutes of exercise that causes your heart to beat faster (aerobic exercise) most days of the week. Activities may include walking, swimming, or biking.  Work with your health care provider or diet and nutrition specialist (dietitian) to adjust your eating plan to your individual calorie needs. Reading food labels  Check food labels for the amount of sodium per serving. Choose foods with less than 5 percent of the Daily Value of sodium. Generally, foods with less than 300 mg of sodium per serving fit into this eating plan.  To find whole grains, look for the word "whole" as the first word in the ingredient list. Shopping  Buy products labeled as "low-sodium" or "no salt added."  Buy fresh foods. Avoid canned foods and premade or frozen meals. Cooking  Avoid adding salt when cooking. Use salt-free seasonings or herbs instead of table salt or sea salt. Check with your health care provider or pharmacist before using salt substitutes.  Do not fry foods. Cook foods using healthy methods such as baking, boiling, grilling, and broiling instead.  Cook with heart-healthy oils, such as olive, canola, soybean, or sunflower oil. Meal planning   Eat a balanced diet that includes: ? 5 or more servings of fruits and vegetables each day. At each meal, try to fill half of your plate with fruits and vegetables. ? Up to 6-8 servings of whole grains each day. ? Less than 6 oz of lean meat, poultry, or fish each day. A 3-oz serving of meat is about the same size as a deck of cards. One egg equals 1 oz. ? 2 servings of low-fat dairy each day. ? A serving of nuts, seeds, or beans 5 times each week. ? Heart-healthy fats. Healthy fats called Omega-3 fatty acids are found in foods such as flaxseeds and coldwater fish, like sardines,  salmon, and mackerel.  Limit how much you eat of the following: ? Canned or prepackaged foods. ? Food that is high in trans fat, such as fried foods. ? Food that is high in saturated fat, such as fatty meat. ? Sweets, desserts, sugary drinks, and other foods with added sugar. ? Full-fat dairy products.  Do not salt foods before eating.  Try to eat at least 2 vegetarian meals each week.  Eat more home-cooked food and less restaurant, buffet, and fast food.  When eating at a restaurant, ask that your food be prepared with less salt or no salt, if possible. What foods are recommended? The items listed may not be a complete list. Talk with your dietitian about what dietary choices are best for you. Grains Whole-grain or whole-wheat bread. Whole-grain or whole-wheat pasta. Brown rice. [place]. Bulgur. Whole-grain and low-sodium cereals.  Pita bread. Low-fat, low-sodium crackers. Whole-wheat flour tortillas. Vegetables Fresh or frozen vegetables (raw, steamed, roasted, or grilled). Low-sodium or reduced-sodium tomato and vegetable juice. Low-sodium or reduced-sodium tomato sauce and tomato paste. Low-sodium or reduced-sodium canned vegetables. Fruits All fresh, dried, or frozen fruit. Canned fruit in natural juice (without added sugar). Meat and other protein foods Skinless chicken or [place]. Ground chicken or [place]. Pork with fat trimmed off. Fish and seafood. Egg whites. Dried beans, peas, or lentils. Unsalted nuts, nut butters, and seeds. Unsalted canned beans. Lean cuts of beef with fat trimmed off. Low-sodium, lean deli meat. Dairy Low-fat (1%) or fat-free (skim) milk. Fat-free, low-fat, or reduced-fat cheeses. Nonfat, low-sodium ricotta or cottage cheese. Low-fat or nonfat yogurt. Low-fat, low-sodium cheese. Fats and oils Soft margarine without trans fats. Vegetable oil. Low-fat, reduced-fat, or light mayonnaise and salad dressings (reduced-sodium). Canola, safflower, olive,  soybean, and sunflower oils. Avocado. Seasoning and other foods Herbs. Spices. Seasoning mixes without salt. Unsalted popcorn and pretzels. Fat-free sweets. What foods are not recommended? The items listed may not be a complete list. Talk with your dietitian about what dietary choices are best for you. Grains Baked goods made with fat, such as croissants, muffins, or some breads. Dry pasta or rice meal packs. Vegetables Creamed or fried vegetables. Vegetables in a cheese sauce. Regular canned vegetables (not low-sodium or reduced-sodium). Regular canned tomato sauce and paste (not low-sodium or reduced-sodium). Regular tomato and vegetable juice (not low-sodium or reduced-sodium). Angie Fava. Olives. Fruits Canned fruit in a light or heavy syrup. Fried fruit. Fruit in cream or butter sauce. Meat and other protein foods Fatty cuts of meat. Ribs. Fried meat. Berniece Salines. Sausage. Bologna and other processed lunch meats. Salami. Fatback. Hotdogs. Bratwurst. Salted nuts and seeds. Canned beans with added salt. Canned or smoked fish. Whole eggs or egg yolks. Chicken or [place] with skin. Dairy Whole or 2% milk, cream, and half-and-half. Whole or full-fat cream cheese. Whole-fat or sweetened yogurt. Full-fat cheese. Nondairy creamers. Whipped toppings. Processed cheese and cheese spreads. Fats and oils Butter. Stick margarine. Lard. Shortening. Ghee. Bacon fat. Tropical oils, such as coconut, palm kernel, or palm oil. Seasoning and other foods Salted popcorn and pretzels. Onion salt, garlic salt, seasoned salt, table salt, and sea salt. Worcestershire sauce. Tartar sauce. Barbecue sauce. Teriyaki sauce. Soy sauce, including reduced-sodium. Steak sauce. Canned and packaged gravies. Fish sauce. Oyster sauce. Cocktail sauce. Horseradish that you find on the shelf. Ketchup. Mustard. Meat flavorings and tenderizers. Bouillon cubes. Hot sauce and Tabasco sauce. Premade or packaged marinades. Premade or packaged taco  seasonings. Relishes. Regular salad dressings. Where to find more information:  National Heart, Lung, and [place]: https://wilson-eaton.com/  American Heart Association: www.heart.org Summary  The DASH eating plan is a healthy eating plan that has been shown to reduce high blood pressure (hypertension). It may also reduce your risk for type 2 diabetes, heart disease, and stroke.  With the DASH eating plan, you should limit salt (sodium) intake to 2,300 mg a day. If you have hypertension, you may need to reduce your sodium intake to 1,500 mg a day.  When on the DASH eating plan, aim to eat more fresh fruits and vegetables, whole grains, lean proteins, low-fat dairy, and heart-healthy fats.  Work with your health care provider or diet and nutrition specialist (dietitian) to adjust your eating plan to your individual calorie needs. This information is not intended to replace advice given to you by your health care provider. Make sure you discuss any questions  you have with your health care provider. Document Released: 01/11/2011 Document Revised: 01/16/2016 Document Reviewed: 01/16/2016 Elsevier Interactive Patient Education  2018 [place]. Na Waldrip M.D.

## 2017-03-26 ENCOUNTER — Encounter: Payer: Self-pay | Admitting: Internal Medicine

## 2017-03-26 ENCOUNTER — Ambulatory Visit: Payer: Self-pay | Admitting: Internal Medicine

## 2017-03-26 VITALS — BP 140/96 | HR 73 | Temp 98.4°F | Wt 291.2 lb

## 2017-03-26 DIAGNOSIS — Z720 Tobacco use: Secondary | ICD-10-CM | POA: Insufficient documentation

## 2017-03-26 DIAGNOSIS — Z7689 Persons encountering health services in other specified circumstances: Secondary | ICD-10-CM

## 2017-03-26 DIAGNOSIS — I10 Essential (primary) hypertension: Secondary | ICD-10-CM

## 2017-03-26 DIAGNOSIS — Z79899 Other long term (current) drug therapy: Secondary | ICD-10-CM

## 2017-03-26 MED ORDER — HYDROCHLOROTHIAZIDE 25 MG PO TABS: 25.0000 mg | ORAL_TABLET | Freq: Every day | ORAL | 0 refills | Status: DC

## 2017-03-26 NOTE — Patient Instructions (Addendum)
hctz  25 mg is inexpensive to get .     Begin medication make sure  Low sodium diet  dash   Plan ROV .   In  2-3 months or as needed.    lab work would be due  In 1-2 months      [place] stands for "Dietary Approaches to Stop Hypertension." The DASH eating plan is a healthy eating plan that has been shown to reduce high blood pressure (hypertension). It may also reduce your risk for type 2 diabetes, heart disease, and stroke. The DASH eating plan may also help with weight loss. What are tips for following this plan? General guidelines  Avoid eating more than 2,300 mg (milligrams) of salt (sodium) a day. If you have hypertension, you may need to reduce your sodium intake to 1,500 mg a day.  Limit alcohol intake to no more than 1 drink a day for nonpregnant women and 2 drinks a day for men. One drink equals 12 oz of beer, 5 oz of wine, or 1 oz of hard liquor.  Work with your health care provider to maintain a healthy body weight or to lose weight. Ask what an ideal weight is for you.  Get at least 30 minutes of exercise that causes your heart to beat faster (aerobic exercise) most days of the week. Activities may include walking, swimming, or biking.  Work with your health care provider or diet and nutrition specialist (dietitian) to adjust your eating plan to your individual calorie needs. Reading food labels  Check food labels for the amount of sodium per serving. Choose foods with less than 5 percent of the Daily Value of sodium. Generally, foods with less than 300 mg of sodium per serving fit into this eating plan.  To find whole grains, look for the word "whole" as the first word in the ingredient list. Shopping  Buy products labeled as "low-sodium" or "no salt added."  Buy fresh foods. Avoid canned foods and premade or frozen meals. Cooking  Avoid adding salt when cooking. Use salt-free seasonings or herbs instead of table salt or sea salt. Check with your  health care provider or pharmacist before using salt substitutes.  Do not fry foods. Cook foods using healthy methods such as baking, boiling, grilling, and broiling instead.  Cook with heart-healthy oils, such as olive, canola, soybean, or sunflower oil. Meal planning   Eat a balanced diet that includes: ? 5 or more servings of fruits and vegetables each day. At each meal, try to fill half of your plate with fruits and vegetables. ? Up to 6-8 servings of whole grains each day. ? Less than 6 oz of lean meat, poultry, or fish each day. A 3-oz serving of meat is about the same size as a deck of cards. One egg equals 1 oz. ? 2 servings of low-fat dairy each day. ? A serving of nuts, seeds, or beans 5 times each week. ? Heart-healthy fats. Healthy fats called Omega-3 fatty acids are found in foods such as flaxseeds and coldwater fish, like sardines, salmon, and mackerel.  Limit how much you eat of the following: ? Canned or prepackaged foods. ? Food that is high in trans fat, such as fried foods. ? Food that is high in saturated fat, such as fatty meat. ? Sweets, desserts, sugary drinks, and other foods with added sugar. ? Full-fat dairy products.  Do not salt foods before eating.  Try to eat at least 2 vegetarian  meals each week.  Eat more home-cooked food and less restaurant, buffet, and fast food.  When eating at a restaurant, ask that your food be prepared with less salt or no salt, if possible. What foods are recommended? The items listed may not be a complete list. Talk with your dietitian about what dietary choices are best for you. Grains Whole-grain or whole-wheat bread. Whole-grain or whole-wheat pasta. Brown rice. [place]. Bulgur. Whole-grain and low-sodium cereals. Pita bread. Low-fat, low-sodium crackers. Whole-wheat flour tortillas. Vegetables Fresh or frozen vegetables (raw, steamed, roasted, or grilled). Low-sodium or reduced-sodium tomato and vegetable juice.  Low-sodium or reduced-sodium tomato sauce and tomato paste. Low-sodium or reduced-sodium canned vegetables. Fruits All fresh, dried, or frozen fruit. Canned fruit in natural juice (without added sugar). Meat and other protein foods Skinless chicken or [place]. Ground chicken or [place]. Pork with fat trimmed off. Fish and seafood. Egg whites. Dried beans, peas, or lentils. Unsalted nuts, nut butters, and seeds. Unsalted canned beans. Lean cuts of beef with fat trimmed off. Low-sodium, lean deli meat. Dairy Low-fat (1%) or fat-free (skim) milk. Fat-free, low-fat, or reduced-fat cheeses. Nonfat, low-sodium ricotta or cottage cheese. Low-fat or nonfat yogurt. Low-fat, low-sodium cheese. Fats and oils Soft margarine without trans fats. Vegetable oil. Low-fat, reduced-fat, or light mayonnaise and salad dressings (reduced-sodium). Canola, safflower, olive, soybean, and sunflower oils. Avocado. Seasoning and other foods Herbs. Spices. Seasoning mixes without salt. Unsalted popcorn and pretzels. Fat-free sweets. What foods are not recommended? The items listed may not be a complete list. Talk with your dietitian about what dietary choices are best for you. Grains Baked goods made with fat, such as croissants, muffins, or some breads. Dry pasta or rice meal packs. Vegetables Creamed or fried vegetables. Vegetables in a cheese sauce. Regular canned vegetables (not low-sodium or reduced-sodium). Regular canned tomato sauce and paste (not low-sodium or reduced-sodium). Regular tomato and vegetable juice (not low-sodium or reduced-sodium). Angie Fava. Olives. Fruits Canned fruit in a light or heavy syrup. Fried fruit. Fruit in cream or butter sauce. Meat and other protein foods Fatty cuts of meat. Ribs. Fried meat. Berniece Salines. Sausage. Bologna and other processed lunch meats. Salami. Fatback. Hotdogs. Bratwurst. Salted nuts and seeds. Canned beans with added salt. Canned or smoked fish. Whole eggs or egg yolks. Chicken  or [place] with skin. Dairy Whole or 2% milk, cream, and half-and-half. Whole or full-fat cream cheese. Whole-fat or sweetened yogurt. Full-fat cheese. Nondairy creamers. Whipped toppings. Processed cheese and cheese spreads. Fats and oils Butter. Stick margarine. Lard. Shortening. Ghee. Bacon fat. Tropical oils, such as coconut, palm kernel, or palm oil. Seasoning and other foods Salted popcorn and pretzels. Onion salt, garlic salt, seasoned salt, table salt, and sea salt. Worcestershire sauce. Tartar sauce. Barbecue sauce. Teriyaki sauce. Soy sauce, including reduced-sodium. Steak sauce. Canned and packaged gravies. Fish sauce. Oyster sauce. Cocktail sauce. Horseradish that you find on the shelf. Ketchup. Mustard. Meat flavorings and tenderizers. Bouillon cubes. Hot sauce and Tabasco sauce. Premade or packaged marinades. Premade or packaged taco seasonings. Relishes. Regular salad dressings. Where to find more information:  National Heart, Lung, and [place]: https://wilson-eaton.com/  American Heart Association: www.heart.org Summary  The DASH eating plan is a healthy eating plan that has been shown to reduce high blood pressure (hypertension). It may also reduce your risk for type 2 diabetes, heart disease, and stroke.  With the DASH eating plan, you should limit salt (sodium) intake to 2,300 mg a day. If you have hypertension, you may need to  reduce your sodium intake to 1,500 mg a day.  When on the DASH eating plan, aim to eat more fresh fruits and vegetables, whole grains, lean proteins, low-fat dairy, and heart-healthy fats.  Work with your health care provider or diet and nutrition specialist (dietitian) to adjust your eating plan to your individual calorie needs. This information is not intended to replace advice given to you by your health care provider. Make sure you discuss any questions you have with your health care provider. Document Released: 01/11/2011 Document Revised:  01/16/2016 Document Reviewed: 01/16/2016 Elsevier Interactive Patient Education  [employer].

## 2017-07-14 ENCOUNTER — Emergency Department (HOSPITAL_BASED_OUTPATIENT_CLINIC_OR_DEPARTMENT_OTHER): Payer: Self-pay

## 2017-07-14 ENCOUNTER — Encounter (HOSPITAL_BASED_OUTPATIENT_CLINIC_OR_DEPARTMENT_OTHER): Payer: Self-pay | Admitting: Emergency Medicine

## 2017-07-14 ENCOUNTER — Other Ambulatory Visit: Payer: Self-pay

## 2017-07-14 ENCOUNTER — Emergency Department (HOSPITAL_BASED_OUTPATIENT_CLINIC_OR_DEPARTMENT_OTHER)
Admission: EM | Admit: 2017-07-14 | Discharge: 2017-07-14 | Disposition: A | Discharge status: Home / Self Care | Payer: Self-pay | Attending: Emergency Medicine | Admitting: Emergency Medicine

## 2017-07-14 DIAGNOSIS — Z7982 Long term (current) use of aspirin: Secondary | ICD-10-CM | POA: Insufficient documentation

## 2017-07-14 DIAGNOSIS — F17210 Nicotine dependence, cigarettes, uncomplicated: Secondary | ICD-10-CM | POA: Insufficient documentation

## 2017-07-14 DIAGNOSIS — Y9289 Other specified places as the place of occurrence of the external cause: Secondary | ICD-10-CM | POA: Insufficient documentation

## 2017-07-14 DIAGNOSIS — Z79899 Other long term (current) drug therapy: Secondary | ICD-10-CM | POA: Insufficient documentation

## 2017-07-14 DIAGNOSIS — I10 Essential (primary) hypertension: Secondary | ICD-10-CM | POA: Insufficient documentation

## 2017-07-14 DIAGNOSIS — Y9389 Activity, other specified: Secondary | ICD-10-CM | POA: Insufficient documentation

## 2017-07-14 DIAGNOSIS — S8492XA Injury of unspecified nerve at lower leg level, left leg, initial encounter: Secondary | ICD-10-CM

## 2017-07-14 DIAGNOSIS — Y999 Unspecified external cause status: Secondary | ICD-10-CM | POA: Insufficient documentation

## 2017-07-14 DIAGNOSIS — S61011A Laceration without foreign body of right thumb without damage to nail, initial encounter: Secondary | ICD-10-CM | POA: Insufficient documentation

## 2017-07-14 DIAGNOSIS — W230XXA Caught, crushed, jammed, or pinched between moving objects, initial encounter: Secondary | ICD-10-CM | POA: Insufficient documentation

## 2017-07-14 NOTE — ED Provider Notes (Signed)
[place] EMERGENCY DEPARTMENT Provider Note   CSN: [id number] Arrival date & time: 07/14/17  0856     History   Chief Complaint Chief Complaint  Patient presents with  . Finger Injury    HPI Justin [name] is a 47 y.o. male.  HPI Patient presents to the emergency room for evaluation of her right thumb injury.  Patient states he slammed his thumb in a car door on Friday.  He sustained a laceration.  Patient is a little bit anxious about seeing the doctor so he did not want to come right away.  Patient states the side of his thumb feels a little bit numb.  Denies any fevers or chills.  No other injuries. Past Medical History:  Diagnosis Date  . Arrhythmia   . Arthritis    Both knees  . Chicken pox    As a child  . High cholesterol   . Hypertension    on meds since 1998   . Left ventricular hypertrophy    card work up [facility] ? inc QRS  . Migraines    Triggered by stress/tension  . OSA (obstructive sleep apnea)     Patient Active Problem List   Diagnosis Date Noted  . Tobacco use 03/26/2017  . Acute ischemic stroke (HCC) - TIA:  Possible right thalamic/IC infarct, secondary to small vessel disease source. Risk factor including HTN, HLD, cocaine use, OSA, smoking 09/30/2016  . Bilateral groin pain 11/30/2013  . Lumbago 11/30/2013  . Urinary frequency 11/30/2013  . Lower urinary tract symptoms (LUTS) 11/30/2013  . Right-sided low back pain without sciatica 11/23/2013  . Achilles tendinitis of right lower extremity 09/23/2013  . Hyperglycemia 08/12/2013  . OSA (obstructive sleep apnea) 08/12/2013  . Morbid obesity ([place]) 08/12/2013  . Hypertension   . Left ventricular hypertrophy   . High cholesterol     Past Surgical History:  Procedure Laterality Date  . KNEE ARTHROPLASTY Left    left 2013 ligaments         Home Medications    Prior to Admission medications   Medication Sig Start Date End Date Taking? Authorizing Provider  aspirin EC 81 MG  EC tablet Take 1 tablet (81 mg total) by mouth daily. 10/03/16   Patteson, Arlan Organ, NP  hydrochlorothiazide (HYDRODIURIL) 25 MG tablet Take 1 tablet (25 mg total) by mouth daily. 03/26/17   Panosh, Standley Brooking, MD    Family History Family History  Problem Relation Age of Onset  . Hypertension Mother   . Other Father        murdered   . Breast cancer Maternal Grandmother   . Breast cancer Maternal Aunt        x 3  . Colon cancer Neg Hx     Social History Social History   Tobacco Use  . Smoking status: Current Every Day Smoker    Packs/day: 0.25    Years: 30.00    Pack years: 7.50    Types: Cigarettes  . Smokeless tobacco: Never Used  . Tobacco comment: tobacco info given 02/24/14  Substance Use Topics  . Alcohol use: Yes    Alcohol/week: 0.0 oz    Comment: rarely  . Drug use: No    Comment: UDS 09/2016, no "current use" noted     Allergies   Penicillins   Review of Systems Review of Systems  All other systems reviewed and are negative.    Physical Exam Updated Vital Signs BP (!) 134/104 (BP Location: Left  Arm)   Pulse 74   Temp 98.4 F (36.9 C) (Oral)   Resp 18   Ht 1.727 m (5\' 8" )   Wt 118.8 kg (262 lb)   SpO2 97%   BMI 39.84 kg/m   Physical Exam  Constitutional: He appears well-developed and well-nourished. No distress.  HENT:  Head: Normocephalic and atraumatic.  Right Ear: External ear normal.  Left Ear: External ear normal.  Eyes: Conjunctivae are normal. Right eye exhibits no discharge. Left eye exhibits no discharge. No scleral icterus.  Neck: Neck supple. No tracheal deviation present.  Cardiovascular: Normal rate.  Pulmonary/Chest: Effort normal. No stridor. No respiratory distress.  Abdominal: He exhibits no distension.  Musculoskeletal: He exhibits no edema.  Wedge-shaped flap laceration on the lateral aspect of his right thumb around the MCP joint, no tendon or joint involvement noted at the base of the wound, no active bleeding, no purulent  drainage, patient does have sensation at the distal aspect of his thumb but it does feel decreased compared to the other side, normal cap refill  Neurological: He is alert. Cranial nerve deficit: no gross deficits.  Skin: Skin is warm and dry. No rash noted.  Psychiatric: He has a normal mood and affect.  Nursing note and vitals reviewed.    ED Treatments / Results  Labs (all labs ordered are listed, but only abnormal results are displayed) Labs Reviewed - No data to display  EKG None  Radiology Dg Finger Thumb Right  Result Date: 07/14/2017 CLINICAL DATA:  Slammed thumb in car door 2 days ago. Abrasion. Initial encounter. EXAM: RIGHT THUMB 2+V COMPARISON:  None. FINDINGS: Probable dorsal soft tissue injury over the metacarpal phalangeal joint. No evidence of foreign body or definite soft tissue emphysema. The mineralization and alignment are normal. There is no evidence of acute fracture or dislocation. Mild degenerative changes are present at the metacarpal phalangeal and interphalangeal joint. IMPRESSION: Dorsal soft tissue injury. No evidence of acute fracture or dislocation. Electronically Signed   By: Richardean Sale M.D.   On: 07/14/2017 09:50    Procedures .Marland KitchenLaceration Repair Date/Time: 07/14/2017 10:37 AM Performed by: Dorie Rank, MD Authorized by: Dorie Rank, MD   Consent:    Consent obtained:  Verbal   Consent given by:  Patient   Risks discussed:  Infection, need for additional repair, pain, poor cosmetic result and poor wound healing   Alternatives discussed:  No treatment and delayed treatment Universal protocol:    Procedure explained and questions answered to patient or proxy's satisfaction: yes     Relevant documents present and verified: yes     Test results available and properly labeled: yes     Imaging studies available: yes     Required blood products, implants, devices, and special equipment available: yes     Site/side marked: yes     Immediately prior to  procedure, a time out was called: yes     Patient identity confirmed:  Verbally with patient Anesthesia (see MAR for exact dosages):    Anesthesia method:  None Laceration details:    Location:  Finger   Finger location:  R thumb   Length (cm):  2   Depth (mm):  3 Repair type:    Repair type:  Simple Exploration:    Wound extent: no areolar tissue violation noted, no foreign bodies/material noted, no muscle damage noted, no tendon damage noted and no underlying fracture noted  Nerve damage: possible.     Contaminated: no   Treatment:  Area cleansed with:  Saline   Amount of cleaning:  Standard Skin repair:    Repair method:  Steri-Strips   Number of Steri-Strips:  5 Approximation:    Approximation:  Close Post-procedure details:    Patient tolerance of procedure:  Tolerated well, no immediate complications   (including critical care time)  Medications Ordered in ED Medications - No data to display   Initial Impression / Assessment and Plan / ED Course  I have reviewed the triage vital signs and the nursing notes.  Pertinent labs & imaging results that were available during my care of the patient were reviewed by me and considered in my medical decision making (see chart for details).   Patient presents to the emergency room with complaints of a laceration occurred on Friday.  At this point I do not think it safe to suture because of potential risk of infection.  Patient wound was vaccinated using Steri-Strips and benzoin.  A thumb splint was applied.  Patient is complaining of numbness.  It is possible that he did injure the digital nerve.  We will refer him to the on call ortho provider.  Final Clinical Impressions(s) / ED Diagnoses   Final diagnoses:  Laceration of right thumb without foreign body without damage to nail, initial encounter  Neurapraxia of left lower extremity, initial encounter    ED Discharge Orders    None       Dorie Rank, MD 07/14/17 1040

## 2017-07-14 NOTE — ED Notes (Signed)
Patient transported to X-ray 

## 2017-07-14 NOTE — Discharge Instructions (Addendum)
Use the splint to help prevent your thumb moving while the wound is healing, you can replace the strips if they fall off

## 2017-07-14 NOTE — ED Triage Notes (Signed)
Pt slammed R thumb in car door on Friday. Pt reports ongoing pain and states there is a laceration.

## 2017-09-12 ENCOUNTER — Encounter (HOSPITAL_BASED_OUTPATIENT_CLINIC_OR_DEPARTMENT_OTHER): Payer: Self-pay | Admitting: Emergency Medicine

## 2017-09-12 ENCOUNTER — Other Ambulatory Visit: Payer: Self-pay

## 2017-09-12 ENCOUNTER — Emergency Department (HOSPITAL_BASED_OUTPATIENT_CLINIC_OR_DEPARTMENT_OTHER)
Admission: EM | Admit: 2017-09-12 | Discharge: 2017-09-12 | Disposition: A | Discharge status: Home / Self Care | Payer: Self-pay | Attending: Emergency Medicine | Admitting: Emergency Medicine

## 2017-09-12 DIAGNOSIS — Z7982 Long term (current) use of aspirin: Secondary | ICD-10-CM | POA: Insufficient documentation

## 2017-09-12 DIAGNOSIS — M25561 Pain in right knee: Secondary | ICD-10-CM | POA: Insufficient documentation

## 2017-09-12 DIAGNOSIS — Z96652 Presence of left artificial knee joint: Secondary | ICD-10-CM | POA: Insufficient documentation

## 2017-09-12 DIAGNOSIS — M25562 Pain in left knee: Secondary | ICD-10-CM | POA: Insufficient documentation

## 2017-09-12 DIAGNOSIS — G8929 Other chronic pain: Secondary | ICD-10-CM | POA: Insufficient documentation

## 2017-09-12 DIAGNOSIS — I10 Essential (primary) hypertension: Secondary | ICD-10-CM | POA: Insufficient documentation

## 2017-09-12 DIAGNOSIS — F17210 Nicotine dependence, cigarettes, uncomplicated: Secondary | ICD-10-CM | POA: Insufficient documentation

## 2017-09-12 MED ORDER — DICLOFENAC SODIUM 1 % TD GEL: 2.0000 g | Freq: Four times a day (QID) | TRANSDERMAL | 0 refills | Status: AC

## 2017-09-12 MED ORDER — TRAMADOL HCL 50 MG PO TABS: 50.0000 mg | ORAL_TABLET | Freq: Four times a day (QID) | ORAL | 0 refills | Status: DC | PRN

## 2017-09-12 MED FILL — DICLOFENAC SODIUM 1% GEL: 1 | 13 days supply | Qty: 100 | Fill #0

## 2017-09-12 MED FILL — traMADol HCL 50 MG TABS: 50 | 2 days supply | Qty: 10 | Fill #0

## 2017-09-12 NOTE — Discharge Instructions (Signed)
Return to ED for worsening symptoms, numbness in legs, additional injuries or falls, red hot or tender joint, swelling.

## 2017-09-12 NOTE — ED Provider Notes (Signed)
[place] EMERGENCY DEPARTMENT Provider Note   CSN: [id number] Arrival date & time: 09/12/17  1308     History   Chief Complaint Chief Complaint  Patient presents with  . Knee Pain    HPI Justin [name] is a 47 y.o. male with a past medical history of hypertension, bilateral knee arthritis, who presents to ED for evaluation of bilateral knee pain for the past week.  States that he was at work when he tripped on a hole.  She denies any direct injury to either knee.  He has had intermittent knee for the past 10 years.  He has a history of ACL repair in his right knee and meniscus repair in his left knee.  States that his job requires him to constantly walk on the cement ground.  He has been taking ibuprofen, Aleve, Tylenol with no improvement in his symptoms.  Denies any numbness in legs, history of gout, fever, red hot tender joint.  He does note a history of foot drop since 2013 in his L foot.  HPI  Past Medical History:  Diagnosis Date  . Arrhythmia   . Arthritis    Both knees  . Chicken pox    As a child  . High cholesterol   . Hypertension    on meds since 1998   . Left ventricular hypertrophy    card work up [facility] ? inc QRS  . Migraines    Triggered by stress/tension  . OSA (obstructive sleep apnea)     Patient Active Problem List   Diagnosis Date Noted  . Tobacco use 03/26/2017  . Acute ischemic stroke (HCC) - TIA:  Possible right thalamic/IC infarct, secondary to small vessel disease source. Risk factor including HTN, HLD, cocaine use, OSA, smoking 09/30/2016  . Bilateral groin pain 11/30/2013  . Lumbago 11/30/2013  . Urinary frequency 11/30/2013  . Lower urinary tract symptoms (LUTS) 11/30/2013  . Right-sided low back pain without sciatica 11/23/2013  . Achilles tendinitis of right lower extremity 09/23/2013  . Hyperglycemia 08/12/2013  . OSA (obstructive sleep apnea) 08/12/2013  . Morbid obesity ([place]) 08/12/2013  . Hypertension   . Left  ventricular hypertrophy   . High cholesterol     Past Surgical History:  Procedure Laterality Date  . KNEE ARTHROPLASTY Left    left 2013 ligaments         Home Medications    Prior to Admission medications   Medication Sig Start Date End Date Taking? Authorizing Provider  aspirin EC 81 MG EC tablet Take 1 tablet (81 mg total) by mouth daily. 10/03/16   Patteson, Arlan Organ, NP  diclofenac sodium (VOLTAREN) 1 % GEL Apply 2 g topically 4 (four) times daily. 09/12/17   Yilia Sacca, PA-C  hydrochlorothiazide (HYDRODIURIL) 25 MG tablet Take 1 tablet (25 mg total) by mouth daily. 03/26/17   Panosh, Standley Brooking, MD  traMADol (ULTRAM) 50 MG tablet Take 1 tablet (50 mg total) by mouth every 6 (six) hours as needed. 09/12/17   Delia Heady, PA-C    Family History Family History  Problem Relation Age of Onset  . Hypertension Mother   . Other Father        murdered   . Breast cancer Maternal Grandmother   . Breast cancer Maternal Aunt        x 3  . Colon cancer Neg Hx     Social History Social History   Tobacco Use  . Smoking status: Current Every Day Smoker  Packs/day: 0.25    Years: 30.00    Pack years: 7.50    Types: Cigarettes  . Smokeless tobacco: Never Used  . Tobacco comment: tobacco info given 02/24/14  Substance Use Topics  . Alcohol use: Yes    Alcohol/week: 0.0 standard drinks    Comment: rarely  . Drug use: No    Comment: UDS 09/2016, no "current use" noted     Allergies   Penicillins   Review of Systems Review of Systems  Constitutional: Negative for chills and fever.  Musculoskeletal: Positive for arthralgias. Negative for back pain, gait problem, joint swelling, myalgias, neck pain and neck stiffness.  Neurological: Negative for weakness and numbness.     Physical Exam Updated Vital Signs BP (!) 129/93 (BP Location: Right Arm)   Pulse 73   Temp 98.3 F (36.8 C) (Oral)   Resp 18   Ht 5\' 8"  (1.727 m)   Wt 129.3 kg   SpO2 98%   BMI 43.33 kg/m    Physical Exam  Constitutional: He appears well-developed and well-nourished. No distress.  HENT:  Head: Normocephalic and atraumatic.  Eyes: Conjunctivae and EOM are normal. No scleral icterus.  Neck: Normal range of motion.  Pulmonary/Chest: Effort normal. No respiratory distress.  Musculoskeletal:  No tenderness to palpation of bilateral knees.  No erythema, edema or calf tenderness bilaterally.  Full active and passive range of motion of bilateral knees without difficulty.  Foot drop noted on left foot.  Neurological: He is alert.  Skin: No rash noted. He is not diaphoretic.  Psychiatric: He has a normal mood and affect.  Nursing note and vitals reviewed.    ED Treatments / Results  Labs (all labs ordered are listed, but only abnormal results are displayed) Labs Reviewed - No data to display  EKG None  Radiology No results found.  Procedures Procedures (including critical care time)  Medications Ordered in ED Medications - No data to display   Initial Impression / Assessment and Plan / ED Course  I have reviewed the triage vital signs and the nursing notes.  Pertinent labs & imaging results that were available during my care of the patient were reviewed by me and considered in my medical decision making (see chart for details).     47 year old male with past medical history of bilateral knee arthritis presents to ED for evaluation of bilateral knee pain for the past week.  This has worsened since constantly being on his feet for work.  Has been taking anti-inflammatories with no improvement in symptoms.  No changes to range of motion noted on exam.  No signs of infectious or vascular cause of symptoms.  Will treat with short course of pain medication, Voltaren gel and advised him to follow-up with PCP and orthopedist for further evaluation.  Advised to return to ED for any severe worsening symptoms. [place] PMP reviewed with no discrepancies.  Portions of this note were  generated with [employer]. Dictation errors may occur despite best attempts at proofreading.   Final Clinical Impressions(s) / ED Diagnoses   Final diagnoses:  Chronic pain of both knees    ED Discharge Orders         Ordered    diclofenac sodium (VOLTAREN) 1 % GEL  4 times daily     09/12/17 1015    traMADol (ULTRAM) 50 MG tablet  Every 6 hours PRN     09/12/17 1015           Marquelle Musgrave, [place],  PA-C 09/12/17 1019    Tegeler, Gwenyth Allegra, MD 09/12/17 1351

## 2017-09-12 NOTE — ED Triage Notes (Signed)
Bilateral knee pain x 4 days. States he stepped in a hole while mowing and is also on his feet a lot while working.

## 2017-11-19 ENCOUNTER — Encounter (HOSPITAL_BASED_OUTPATIENT_CLINIC_OR_DEPARTMENT_OTHER): Payer: Self-pay | Admitting: Emergency Medicine

## 2017-11-19 ENCOUNTER — Emergency Department (HOSPITAL_BASED_OUTPATIENT_CLINIC_OR_DEPARTMENT_OTHER)
Admission: EM | Admit: 2017-11-19 | Discharge: 2017-11-19 | Disposition: A | Discharge status: Home / Self Care | Payer: Self-pay | Attending: Emergency Medicine | Admitting: Emergency Medicine

## 2017-11-19 ENCOUNTER — Other Ambulatory Visit: Payer: Self-pay

## 2017-11-19 ENCOUNTER — Emergency Department (HOSPITAL_BASED_OUTPATIENT_CLINIC_OR_DEPARTMENT_OTHER): Payer: Self-pay

## 2017-11-19 DIAGNOSIS — F17210 Nicotine dependence, cigarettes, uncomplicated: Secondary | ICD-10-CM | POA: Insufficient documentation

## 2017-11-19 DIAGNOSIS — Z7982 Long term (current) use of aspirin: Secondary | ICD-10-CM | POA: Insufficient documentation

## 2017-11-19 DIAGNOSIS — R51 Headache: Secondary | ICD-10-CM | POA: Insufficient documentation

## 2017-11-19 DIAGNOSIS — M542 Cervicalgia: Secondary | ICD-10-CM | POA: Insufficient documentation

## 2017-11-19 DIAGNOSIS — R519 Headache, unspecified: Secondary | ICD-10-CM

## 2017-11-19 DIAGNOSIS — I10 Essential (primary) hypertension: Secondary | ICD-10-CM | POA: Insufficient documentation

## 2017-11-19 DIAGNOSIS — Z79899 Other long term (current) drug therapy: Secondary | ICD-10-CM | POA: Insufficient documentation

## 2017-11-19 MED ORDER — DIPHENHYDRAMINE HCL 50 MG/ML IJ SOLN
25.0000 mg | Freq: Once | INTRAMUSCULAR | Status: AC
  Administered 2017-11-19: 25 mg via INTRAVENOUS

## 2017-11-19 MED ORDER — DIPHENHYDRAMINE HCL 50 MG/ML IJ SOLN
25.0000 mg | Freq: Once | INTRAMUSCULAR | Status: DC
Start: 2017-11-19 — End: 2017-11-19
  Filled 2017-11-19: qty 1

## 2017-11-19 MED ORDER — PROCHLORPERAZINE EDISYLATE 10 MG/2ML IJ SOLN
10.0000 mg | Freq: Once | INTRAMUSCULAR | Status: DC
  Filled 2017-11-19: qty 2

## 2017-11-19 MED ORDER — PROCHLORPERAZINE EDISYLATE 10 MG/2ML IJ SOLN
10.0000 mg | Freq: Once | INTRAMUSCULAR | Status: AC
  Administered 2017-11-19: 10 mg via INTRAVENOUS

## 2017-11-19 MED ORDER — NAPROXEN 500 MG PO TABS: 500.0000 mg | ORAL_TABLET | Freq: Two times a day (BID) | ORAL | 0 refills | Status: DC

## 2017-11-19 NOTE — ED Notes (Signed)
ED Provider at bedside. 

## 2017-11-19 NOTE — ED Triage Notes (Signed)
Reports right sided neck pain which radiates into head.  States this began 2 days ago and has had blurred vision in the right eye with dizziness.  States that he had the same symptoms a year ago and thought he had a stroke.

## 2017-11-19 NOTE — Discharge Instructions (Addendum)
Please read and follow all provided instructions.  Your diagnoses today include:  1. Bad headache   2. Neck pain     Tests performed today include: CT of your head and neck which was normal and did not show any serious cause of your headache or neck pain Vital signs. See below for your results today.   Medications:  In the Emergency Department you received: Compazine - antinausea/headache medication Benadryl - antihistamine to counteract potential side effects of Compazine  Take any prescribed medications only as directed. Please stop using cocaine.   Additional information:  Follow any educational materials contained in this packet.  You are having a headache. No specific cause was found today for your headache. It may have been a migraine or other cause of headache. Stress, anxiety, fatigue, and depression are common triggers for headaches.   Your headache today does not appear to be life-threatening or require hospitalization, but often the exact cause of headaches is not determined in the emergency department. Therefore, follow-up with your doctor is very important to find out what may have caused your headache and whether or not you need any further diagnostic testing or treatment.   Sometimes headaches can appear benign (not harmful), but then more serious symptoms can develop which should prompt an immediate re-evaluation by your doctor or the emergency department.  BE VERY CAREFUL not to take multiple medicines containing Tylenol (also called acetaminophen). Doing so can lead to an overdose which can damage your liver and cause liver failure and possibly death.   Follow-up instructions: Please follow-up with your primary care provider in the next 3 days for further evaluation of your symptoms.   Return instructions:  Please return to the Emergency Department if you experience worsening symptoms. Return if the medications do not resolve your headache, if it recurs, or if you  have multiple episodes of vomiting or cannot keep down fluids. Return if you have a change from the usual headache. RETURN IMMEDIATELY IF you: Develop a sudden, severe headache Develop confusion or become poorly responsive or faint Develop a fever above 100.46F or problem breathing Have a change in speech, vision, swallowing, or understanding Develop new weakness, numbness, tingling, incoordination in your arms or legs Have a seizure Please return if you have any other emergent concerns.  Additional Information:  Your vital signs today were: BP (!) 140/106 (BP Location: Right Arm)    Pulse 77    Temp 98.8 F (37.1 C) (Oral)    Resp 16    Ht 5\' 8"  (1.727 m)    Wt 127 kg    SpO2 100%    BMI 42.57 kg/m  If your blood pressure (BP) was elevated above 135/85 this visit, please have this repeated by your doctor within one month. --------------

## 2017-11-19 NOTE — ED Provider Notes (Signed)
[facility] EMERGENCY DEPARTMENT Provider Note   CSN: [id number] Arrival date & time: 11/19/17  0109     History   Chief Complaint Chief Complaint  Patient presents with  . Neck Pain    HPI Justin [name] is a 47 y.o. male with a history of hypertension, hyperlipidemia, migraines who presents emergency department today for neck pain and headache.  Patient reports that 2 weeks ago he awoke with pain at the base of his right neck that radiated behind his ear and into the temporal region of his head on the right side.  He notes associated blurred vision that is intermittent and commonly occurs in the morning.  He denies any diplopia.  He reports that the pain is been constant since onset but worsened in the morning.  He describes this as a throbbing pain.  He notes associated photophobia, phonophobia, nausea, right facial tingling, right arm tingling.  He is unsure if he has any weakness in his right arm.  He denies dropping things or weak grip strength in the right hand.  No lower extremity weakness except for chronic foot drop on the left from a prior football injury in high school.  Patient states this feels similar to when he has had his migraines in the past.  He has not seen a neurologist.  He was worked up for a stroke last year with negative CT head, CTA of the head and neck and a negative brain MRI.  At that time his UDS did report positive for cocaine.  Patient does admit to cocaine use 3 weeks ago for "dental pain".  He denies any recent cocaine use since that time.  He has been trying Tylenol and ibuprofen for symptoms without any relief.  Patient notes that he has a area of swelling of his right supraclavicular.  His pain is worsened by neck movement.  He denies any jaw claudication.  He reports some dizziness upon standing that goes away after a few seconds.  No vertigo.  No syncope.  No associated fever, recent neck manipulation by a chiropractor, facial droop, difficulty  with speech, changes in gait, emesis, chest pain, shortness of breath, new medications, trauma, alcohol use or other drug use.   HPI  Past Medical History:  Diagnosis Date  . Arrhythmia   . Arthritis    Both knees  . Chicken pox    As a child  . High cholesterol   . Hypertension    on meds since 1998   . Left ventricular hypertrophy    card work up [facility] ? inc QRS  . Migraines    Triggered by stress/tension  . OSA (obstructive sleep apnea)     Patient Active Problem List   Diagnosis Date Noted  . Tobacco use 03/26/2017  . Acute ischemic stroke (HCC) - TIA:  Possible right thalamic/IC infarct, secondary to small vessel disease source. Risk factor including HTN, HLD, cocaine use, OSA, smoking 09/30/2016  . Bilateral groin pain 11/30/2013  . Lumbago 11/30/2013  . Urinary frequency 11/30/2013  . Lower urinary tract symptoms (LUTS) 11/30/2013  . Right-sided low back pain without sciatica 11/23/2013  . Achilles tendinitis of right lower extremity 09/23/2013  . Hyperglycemia 08/12/2013  . OSA (obstructive sleep apnea) 08/12/2013  . Morbid obesity ([place]) 08/12/2013  . Hypertension   . Left ventricular hypertrophy   . High cholesterol     Past Surgical History:  Procedure Laterality Date  . KNEE ARTHROPLASTY Left    left 2013 ligaments  Home Medications    Prior to Admission medications   Medication Sig Start Date End Date Taking? Authorizing Provider  aspirin EC 81 MG EC tablet Take 1 tablet (81 mg total) by mouth daily. 10/03/16   Patteson, Arlan Organ, NP  diclofenac sodium (VOLTAREN) 1 % GEL Apply 2 g topically 4 (four) times daily. 09/12/17   Khatri, Hina, PA-C  hydrochlorothiazide (HYDRODIURIL) 25 MG tablet Take 1 tablet (25 mg total) by mouth daily. 03/26/17   Panosh, Standley Brooking, MD  traMADol (ULTRAM) 50 MG tablet Take 1 tablet (50 mg total) by mouth every 6 (six) hours as needed. 09/12/17   Delia Heady, PA-C    Family History Family History  Problem Relation Age  of Onset  . Hypertension Mother   . Other Father        murdered   . Breast cancer Maternal Grandmother   . Breast cancer Maternal Aunt        x 3  . Colon cancer Neg Hx     Social History Social History   Tobacco Use  . Smoking status: Current Every Day Smoker    Packs/day: 0.25    Years: 30.00    Pack years: 7.50    Types: Cigarettes  . Smokeless tobacco: Never Used  . Tobacco comment: tobacco info given 02/24/14  Substance Use Topics  . Alcohol use: Yes    Alcohol/week: 0.0 standard drinks    Comment: rarely  . Drug use: No    Comment: UDS 09/2016, no "current use" noted     Allergies   Penicillins   Review of Systems Review of Systems  All other systems reviewed and are negative.    Physical Exam Updated Vital Signs BP (!) 137/94   Pulse 77   Temp 98.8 F (37.1 C) (Oral)   Resp 16   Ht 5\' 8"  (1.727 m)   Wt 127 kg   SpO2 100%   BMI 42.57 kg/m   Physical Exam  Constitutional: He is oriented to person, place, and time. He appears well-developed and well-nourished.  HENT:  Head: Normocephalic and atraumatic.    Right Ear: External ear normal.  Left Ear: External ear normal.  Nose: Nose normal.  Mouth/Throat: Uvula is midline, oropharynx is clear and moist and mucous membranes are normal. No tonsillar exudate.  Temporal artery intact. No jaw claudication.   Eyes: Pupils are equal, round, and reactive to light. Right eye exhibits no discharge. Left eye exhibits no discharge. No scleral icterus.  Neck: Trachea normal. Neck supple. No spinous process tenderness present. No neck rigidity. Normal range of motion present.    Negative Spurlings test.   Cardiovascular: Normal rate, regular rhythm and intact distal pulses.  No murmur heard. Pulses:      Radial pulses are 2+ on the right side, and 2+ on the left side.       Dorsalis pedis pulses are 2+ on the right side, and 2+ on the left side.       Posterior tibial pulses are 2+ on the right side, and  2+ on the left side.  No lower extremity swelling or edema. Calves symmetric in size bilaterally.  Pulmonary/Chest: Effort normal and breath sounds normal. He exhibits no tenderness.  Abdominal: Soft. Bowel sounds are normal. There is no tenderness. There is no rebound and no guarding.  Musculoskeletal: He exhibits no edema.  Lymphadenopathy:    He has no cervical adenopathy.  Neurological: He is alert and oriented to person, place, and  time. He is not disoriented. GCS eye subscore is 4. GCS verbal subscore is 5. GCS motor subscore is 6.  Mental Status:  Alert, oriented, thought content appropriate, able to give a coherent history. Speech fluent without evidence of aphasia. Able to follow 2 step commands without difficulty.  Cranial Nerves:  II:  Peripheral visual fields grossly normal, pupils equal, round, reactive to light III,IV, VI: ptosis not present, extra-ocular motions intact bilaterally  V,VII: smile symmetric, eyebrows raise symmetric, facial light touch sensation decreased on right VIII: hearing grossly normal to voice  X: uvula elevates symmetrically  XI: bilateral shoulder shrug symmetric and strong XII: midline tongue extension without fassiculations Motor:  Normal tone. 5/5 in upper and lower extremities bilaterally including strong and equal grip strength and hip flexion. Chronic foot drop on left. Sensory: Reports decreased sensation to right upper arm and right side of face. Negative Romberg.  Deep Tendon Reflexes: 2+ and symmetric in the biceps and patella Cerebellar: normal finger-to-nose with bilateral upper extremities. Normal heel-to -shin balance bilaterally of the lower extremity. No pronator drift.  Gait: normal gait and balance CV: distal pulses palpable throughout   Skin: Skin is warm and dry. No rash noted. Rash is not vesicular. He is not diaphoretic.  Psychiatric: He has a normal mood and affect.  Nursing note and vitals reviewed.    ED Treatments /  Results  Labs (all labs ordered are listed, but only abnormal results are displayed) Labs Reviewed - No data to display  EKG None  Radiology Ct Head Wo Contrast  Result Date: 11/19/2017 CLINICAL DATA:  Neck pain.  Blurred vision in right eye.  Dizziness. EXAM: CT HEAD WITHOUT CONTRAST CT CERVICAL SPINE WITHOUT CONTRAST TECHNIQUE: Multidetector CT imaging of the head and cervical spine was performed following the standard protocol without intravenous contrast. Multiplanar CT image reconstructions of the cervical spine were also generated. COMPARISON:  MRI 10/01/2016 FINDINGS: CT HEAD FINDINGS Brain: No acute intracranial abnormality. Specifically, no hemorrhage, hydrocephalus, mass lesion, acute infarction, or significant intracranial injury. Vascular: No hyperdense vessel or unexpected calcification. Skull: No acute calvarial abnormality. Sinuses/Orbits: Visualized paranasal sinuses and mastoids clear. Old left orbital floor fracture with herniated fat. No evidence of entrapment. Other: None CT CERVICAL SPINE FINDINGS Alignment: No subluxation. Skull base and vertebrae: No fracture. No primary bone lesion or focal pathologic process. Soft tissues and spinal canal: No prevertebral fluid or swelling. No visible canal hematoma. Disc levels: Disc space narrowing and spurring at C5-6 and C6-7. No significant neural foraminal narrowing. Upper chest: No acute findings Other: None IMPRESSION: No intracranial abnormality. No acute bony abnormality in the cervical spine. Degenerative disc disease changes at C5-6 and C6-7. Electronically Signed   By: Rolm Baptise M.D.   On: 11/19/2017 11:11   Ct Cervical Spine Wo Contrast  Result Date: 11/19/2017 CLINICAL DATA:  Neck pain.  Blurred vision in right eye.  Dizziness. EXAM: CT HEAD WITHOUT CONTRAST CT CERVICAL SPINE WITHOUT CONTRAST TECHNIQUE: Multidetector CT imaging of the head and cervical spine was performed following the standard protocol without intravenous  contrast. Multiplanar CT image reconstructions of the cervical spine were also generated. COMPARISON:  MRI 10/01/2016 FINDINGS: CT HEAD FINDINGS Brain: No acute intracranial abnormality. Specifically, no hemorrhage, hydrocephalus, mass lesion, acute infarction, or significant intracranial injury. Vascular: No hyperdense vessel or unexpected calcification. Skull: No acute calvarial abnormality. Sinuses/Orbits: Visualized paranasal sinuses and mastoids clear. Old left orbital floor fracture with herniated fat. No evidence of entrapment. Other: None CT CERVICAL SPINE  FINDINGS Alignment: No subluxation. Skull base and vertebrae: No fracture. No primary bone lesion or focal pathologic process. Soft tissues and spinal canal: No prevertebral fluid or swelling. No visible canal hematoma. Disc levels: Disc space narrowing and spurring at C5-6 and C6-7. No significant neural foraminal narrowing. Upper chest: No acute findings Other: None IMPRESSION: No intracranial abnormality. No acute bony abnormality in the cervical spine. Degenerative disc disease changes at C5-6 and C6-7. Electronically Signed   By: Rolm Baptise M.D.   On: 11/19/2017 11:11    Procedures Procedures (including critical care time)  Medications Ordered in ED Medications  diphenhydrAMINE (BENADRYL) injection 25 mg (25 mg Intravenous Given 11/19/17 1113)  prochlorperazine (COMPAZINE) injection 10 mg (10 mg Intravenous Given 11/19/17 1113)     Initial Impression / Assessment and Plan / ED Course  I have reviewed the triage vital signs and the nursing notes.  Pertinent labs & imaging results that were available during my care of the patient were reviewed by me and considered in my medical decision making (see chart for details).     47 year old male with a history of hypertension, hyperlipidemia, migraines who presents emergency department today for neck pain and headache that is right-sided and associated with intermittent blurred vision,  tingling of the face and right arm as well as photophobia, phonophobia and nausea.  Reports this feels similar to his prior headaches.  He does not see a neurologist.  Reports he did use cocaine about 3 weeks ago.  Vital signs are reassuring besides mildly elevated blood pressure.  No trauma reported.  On exam patient is without diplopia or focal weakness.  He does report some decreased sensation on the right face and right upper extremity.  Negative Spurling's test.  No indication of cervical radiculopathy.  Symptoms are atypical for CVA or TIA.  He does have some edema of his supraclavicular but no lymphadenopathy.  Unsure the cause.  I discussed the case with Dr. Venora Maples. Will order CT head and neck.   CT head and neck negative. Pt HA treated and improved while in ED with migraine cocktail.  Presentation is like pts typical HA and non concerning for [facility], ICH, Meningitis, or temporal arteritis. Pt is afebrile with no focal neuro deficits, nuchal rigidity, or change in vision. Pt is to follow up with PCP to discuss prophylactic medication. Neurology follow up given. Return precautions discussed. Pt verbalizes understanding and is agreeable with plan to dc.   Final Clinical Impressions(s) / ED Diagnoses   Final diagnoses:  Bad headache  Neck pain    ED Discharge Orders         Ordered    naproxen (NAPROSYN) 500 MG tablet  2 times daily     11/19/17 1311           Lorelle Gibbs 11/19/17 1321    Jola Schmidt, MD 11/19/17 1644

## 2017-11-29 ENCOUNTER — Encounter (HOSPITAL_COMMUNITY): Payer: Self-pay | Admitting: Emergency Medicine

## 2017-11-29 ENCOUNTER — Other Ambulatory Visit: Payer: Self-pay

## 2017-11-29 ENCOUNTER — Emergency Department (HOSPITAL_COMMUNITY)
Admission: EM | Admit: 2017-11-29 | Discharge: 2017-11-29 | Disposition: A | Discharge status: Home / Self Care | Payer: Self-pay | Attending: Emergency Medicine | Admitting: Emergency Medicine

## 2017-11-29 ENCOUNTER — Emergency Department (HOSPITAL_COMMUNITY): Payer: Self-pay

## 2017-11-29 DIAGNOSIS — R55 Syncope and collapse: Secondary | ICD-10-CM | POA: Insufficient documentation

## 2017-11-29 DIAGNOSIS — E860 Dehydration: Secondary | ICD-10-CM | POA: Insufficient documentation

## 2017-11-29 DIAGNOSIS — Z79899 Other long term (current) drug therapy: Secondary | ICD-10-CM | POA: Insufficient documentation

## 2017-11-29 DIAGNOSIS — R7989 Other specified abnormal findings of blood chemistry: Secondary | ICD-10-CM | POA: Insufficient documentation

## 2017-11-29 DIAGNOSIS — F17210 Nicotine dependence, cigarettes, uncomplicated: Secondary | ICD-10-CM | POA: Insufficient documentation

## 2017-11-29 DIAGNOSIS — I10 Essential (primary) hypertension: Secondary | ICD-10-CM | POA: Insufficient documentation

## 2017-11-29 LAB — CBC
HCT: 49.4 % (ref 39.0–52.0)
Hemoglobin: 15.9 g/dL (ref 13.0–17.0)
MCH: 27.9 pg (ref 26.0–34.0)
MCHC: 32.2 g/dL (ref 30.0–36.0)
MCV: 86.8 fL (ref 80.0–100.0)
NRBC: 0 % (ref 0.0–0.2)
PLATELETS: 297 10*3/uL (ref 150–400)
RBC: 5.69 MIL/uL (ref 4.22–5.81)
RDW: 14.5 % (ref 11.5–15.5)
WBC: 11.9 10*3/uL — ABNORMAL HIGH (ref 4.0–10.5)

## 2017-11-29 LAB — BASIC METABOLIC PANEL
ANION GAP: 9 (ref 5–15)
BUN: 22 mg/dL — AB (ref 6–20)
CALCIUM: 9.4 mg/dL (ref 8.9–10.3)
CO2: 26 mmol/L (ref 22–32)
Chloride: 105 mmol/L (ref 98–111)
Creatinine, Ser: 1.43 mg/dL — ABNORMAL HIGH (ref 0.61–1.24)
GFR calc Af Amer: >60 mL/min (ref >60)
GFR, EST NON AFRICAN AMERICAN: 57 mL/min — AB (ref >60)
Glucose, Bld: 125 mg/dL — ABNORMAL HIGH (ref 70–99)
POTASSIUM: 3.8 mmol/L (ref 3.5–5.1)
SODIUM: 140 mmol/L (ref 135–145)

## 2017-11-29 LAB — URINALYSIS, ROUTINE W REFLEX MICROSCOPIC
Bilirubin Urine: NEGATIVE
GLUCOSE, UA: NEGATIVE mg/dL
Hgb urine dipstick: NEGATIVE
Ketones, ur: NEGATIVE mg/dL
Leukocytes, UA: NEGATIVE
Nitrite: NEGATIVE
PH: 5 (ref 5.0–8.0)
Protein, ur: NEGATIVE mg/dL
SPECIFIC GRAVITY, URINE: 1.019 (ref 1.005–1.030)

## 2017-11-29 LAB — I-STAT TROPONIN, ED: TROPONIN I, POC: 0.02 ng/mL (ref 0.00–0.08)

## 2017-11-29 MED ORDER — SODIUM CHLORIDE 0.9 % IV BOLUS
1000.0000 mL | Freq: Once | INTRAVENOUS | Status: AC
  Administered 2017-11-29: 1000 mL via INTRAVENOUS

## 2017-11-29 NOTE — ED Triage Notes (Signed)
Patient reports feeling dizzy upon getting up this morning followed by fall. Denies LOC. Denies chest pain and SOB. Seen at [facility] and sent for further evaluation of abnormal EKG that showed "septal infarct."

## 2017-11-29 NOTE — Discharge Instructions (Addendum)
Your evaluation today is reassuring, I think your syncopal episode was likely due to dehydration make sure you are drinking at least 64 ounces of water daily.  I would like free to follow-up with your primary care doctor in the next week to have your kidney function rechecked and to continue with blood pressure management.  Return to the emergency department if you develop chest pain, worsening shortness of breath if you have additional syncopal episodes or any other new or concerning symptoms.

## 2017-11-29 NOTE — ED Provider Notes (Signed)
[facility] DEPT Provider Note   CSN: [id number] Arrival date & time: 11/29/17  1426     History   Chief Complaint Chief Complaint  Patient presents with  . Abnormal ECG   HPI Justin [name] is a 47 y.o. male.  Justin [name] is a 47 y.o. Male with a hx of HTN, HLD, LVH, OSA and migraines, who presents to the emergency department from urgent care for evaluation of possible abnormal EKG.  Patient reports that he went to stand up from bed this morning started to feel lightheaded and then had a syncopal episode falling to the ground he landed on his stomach, did not hit his head, denies any neck or back pain.  He did not have any preceding chest pain.  He reports he has some mild exertional shortness of breath which is been present for the past year, no worse than normal today.  He attributes this to his smoking, he continues to smoke half pack per day.  No fevers or cough.  No abdominal pain, nausea or vomiting, no melena or hematochezia.  He does report that he does not drink very much water and thinks he might be dehydrated.  He was seen at urgent care and EKG showed a possible septal infarct, on arrival here his EKG has no significant changes were compared to prior and patient is not experiencing any chest pain.     Past Medical History:  Diagnosis Date  . Arrhythmia   . Arthritis    Both knees  . Chicken pox    As a child  . High cholesterol   . Hypertension    on meds since 1998   . Left ventricular hypertrophy    card work up [facility] ? inc QRS  . Migraines    Triggered by stress/tension  . OSA (obstructive sleep apnea)     Patient Active Problem List   Diagnosis Date Noted  . Tobacco use 03/26/2017  . Acute ischemic stroke (HCC) - TIA:  Possible right thalamic/IC infarct, secondary to small vessel disease source. Risk factor including HTN, HLD, cocaine use, OSA, smoking 09/30/2016  . Bilateral groin pain 11/30/2013  . Lumbago  11/30/2013  . Urinary frequency 11/30/2013  . Lower urinary tract symptoms (LUTS) 11/30/2013  . Right-sided low back pain without sciatica 11/23/2013  . Achilles tendinitis of right lower extremity 09/23/2013  . Hyperglycemia 08/12/2013  . OSA (obstructive sleep apnea) 08/12/2013  . Morbid obesity ([place]) 08/12/2013  . Hypertension   . Left ventricular hypertrophy   . High cholesterol     Past Surgical History:  Procedure Laterality Date  . KNEE ARTHROPLASTY Left    left 2013 ligaments         Home Medications    Prior to Admission medications   Medication Sig Start Date End Date Taking? Authorizing Provider  aspirin EC 81 MG EC tablet Take 1 tablet (81 mg total) by mouth daily. 10/03/16   Patteson, Arlan Organ, NP  diclofenac sodium (VOLTAREN) 1 % GEL Apply 2 g topically 4 (four) times daily. 09/12/17   Khatri, Hina, PA-C  hydrochlorothiazide (HYDRODIURIL) 25 MG tablet Take 1 tablet (25 mg total) by mouth daily. 03/26/17   Panosh, Standley Brooking, MD  naproxen (NAPROSYN) 500 MG tablet Take 1 tablet (500 mg total) by mouth 2 (two) times daily. 11/19/17   Maczis, Barth Kirks, PA-C  traMADol (ULTRAM) 50 MG tablet Take 1 tablet (50 mg total) by mouth every 6 (six) hours as needed. 09/12/17  Delia Heady, PA-C    Family History Family History  Problem Relation Age of Onset  . Hypertension Mother   . Other Father        murdered   . Breast cancer Maternal Grandmother   . Breast cancer Maternal Aunt        x 3  . Colon cancer Neg Hx     Social History Social History   Tobacco Use  . Smoking status: Current Every Day Smoker    Packs/day: 0.25    Years: 30.00    Pack years: 7.50    Types: Cigarettes  . Smokeless tobacco: Never Used  . Tobacco comment: tobacco info given 02/24/14  Substance Use Topics  . Alcohol use: Yes    Alcohol/week: 0.0 standard drinks    Comment: rarely  . Drug use: No    Comment: UDS 09/2016, no "current use" noted     Allergies   Penicillins   Review of  Systems Review of Systems  Constitutional: Negative for chills and fever.  HENT: Negative.   Eyes: Negative for visual disturbance.  Respiratory: Positive for shortness of breath. Negative for cough.   Cardiovascular: Negative for chest pain, palpitations and leg swelling.  Gastrointestinal: Negative for abdominal pain, anal bleeding, blood in stool, constipation, diarrhea, nausea and vomiting.  Genitourinary: Negative for dysuria and frequency.  Musculoskeletal: Negative for arthralgias and myalgias.  Skin: Negative for color change and rash.  Neurological: Positive for syncope and light-headedness. Negative for dizziness, facial asymmetry, weakness, numbness and headaches.     Physical Exam Updated Vital Signs BP (!) 139/97 (BP Location: Right Arm)   Pulse 81   Temp 97.9 F (36.6 C) (Oral)   Resp 18   SpO2 98%  Orthostatic VS for the past 24 hrs:  BP- Lying Pulse- Lying BP- Sitting Pulse- Sitting BP- Standing at 0 minutes Pulse- Standing at 0 minutes  11/29/17 1544 (!) 165/108 79 (!) 146/110 76 (!) 166/108 81    Physical Exam  Constitutional: He is oriented to person, place, and time. He appears well-developed and well-nourished. No distress.  HENT:  Head: Normocephalic and atraumatic.  Mouth/Throat: Oropharynx is clear and moist.  Scalp without hematoma, step-off or signs of trauma  Eyes: Pupils are equal, round, and reactive to light. EOM are normal. Right eye exhibits no discharge. Left eye exhibits no discharge.  Neck: Neck supple.  No midline tenderness  Cardiovascular: Normal rate, regular rhythm, normal heart sounds and intact distal pulses. Exam reveals no gallop and no friction rub.  No murmur heard. Pulmonary/Chest: Effort normal and breath sounds normal. No respiratory distress.  Respirations equal and unlabored, patient able to speak in full sentences, lungs clear to auscultation bilaterally  Abdominal: Soft. Bowel sounds are normal. He exhibits no distension  and no mass. There is no tenderness. There is no guarding.  Abdomen soft, nondistended, nontender to palpation in all quadrants without guarding or peritoneal signs  Musculoskeletal: He exhibits no edema or deformity.  T-spine and L-spine nontender to palpation at midline. Patient moves all extremities without difficulty.  Neurological: He is alert and oriented to person, place, and time. Coordination normal.  Speech is clear, able to follow commands CN III-XII intact Normal strength in upper and lower extremities bilaterally including dorsiflexion and plantar flexion, strong and equal grip strength Sensation normal to light and sharp touch Moves extremities without ataxia, coordination intact Normal finger to nose and rapid alternating movements No pronator drift  Skin: Skin is warm and dry.  Capillary refill takes less than 2 seconds. He is not diaphoretic.  Psychiatric: He has a normal mood and affect. His behavior is normal.  Nursing note and vitals reviewed.    ED Treatments / Results  Labs (all labs ordered are listed, but only abnormal results are displayed) Labs Reviewed  CBC - Abnormal; Notable for the following components:      Result Value   WBC 11.9 (*)    All other components within normal limits  BASIC METABOLIC PANEL - Abnormal; Notable for the following components:   Glucose, Bld 125 (*)    BUN 22 (*)    Creatinine, Ser 1.43 (*)    GFR calc non Af Amer 57 (*)    All other components within normal limits  URINALYSIS, ROUTINE W REFLEX MICROSCOPIC  I-STAT TROPONIN, ED    EKG EKG Interpretation  Date/Time:  Friday November 29 2017 14:35:19 EDT Ventricular Rate:  82 PR Interval:    QRS Duration: 124 QT Interval:  385 QTC Calculation: 450 R Axis:   -23 Text Interpretation:  Sinus rhythm Right bundle branch block Minimal ST elevation, anterior leads No significant change since last tracing Confirmed by Wandra Arthurs [id number] on 11/29/2017 3:27:10  PM   Radiology Dg Chest 2 View  Result Date: 11/29/2017 CLINICAL DATA:  47 year old male with chest discomfort cough shortness of breath and dizziness. EXAM: CHEST - 2 VIEW COMPARISON:  Portable chest 09/30/2016 and earlier. FINDINGS: Stable lung volumes with chronic elevation or eventration of the diaphragm. Chronic basilar and perihilar linear scarring or atelectasis is stable since 2016. Mediastinal contours remain within normal limits. Visualized tracheal air column is within normal limits. No pneumothorax, pulmonary edema, pleural effusion or confluent pulmonary opacity. No acute osseous abnormality identified. Negative visible bowel gas pattern. IMPRESSION: 1.  No acute cardiopulmonary abnormality. 2. Chronic linear atelectasis or scarring in both lungs. Electronically Signed   By: Genevie Ann M.D.   On: 11/29/2017 15:43    Procedures Procedures (including critical care time)  Medications Ordered in ED Medications  sodium chloride 0.9 % bolus 1,000 mL (1,000 mLs Intravenous New Bag/Given (Non-Interop) 11/29/17 1603)     Initial Impression / Assessment and Plan / ED Course  I have reviewed the triage vital signs and the nursing notes.  Pertinent labs & imaging results that were available during my care of the patient were reviewed by me and considered in my medical decision making (see chart for details).  Patient presents for evaluation from urgent care after syncopal episode this morning with question of possible abnormal EKG.  EKG from urgent care showed possible septal infarct but in comparison to prior EKGs here there is no significant change no significant ST elevation or depression.  Patient stood up from the bed this morning felt lightheaded and then passed out he fell forward, did not hit his head, he denies any injuries from the fall.  He denies any preceding chest pain.  He has some shortness of breath intermittently with exertion for the past year and this is not acutely  worsened.  No lower extremity swelling.  On arrival patient has normal vitals and is in no acute distress.  Syncope may be related to dehydration, will check basic labs, troponin, chest x-ray and urinalysis.  Will give 1 L fluid bolus.  Patient did have 20 point drop in systolic when going from sitting to standing.  Labs overall reassuring, minimal leukocytosis of 11.9, normal hemoglobin, no acute electrolyte derangements requiring intervention, patient does  have slight increase in creatinine, 1.43 today, usually 1.2.  Negative troponin.  Chest x-ray shows chronic basilar scarring but no acute cardiopulmonary disease.  Given slight bump in creatinine suspect dehydration as cause for patient's syncope, after fluids he is ambulatory here in the department without difficulty, no lightheadedness or dizziness.  At this time I feel patient is stable for discharge home I have encouraged him to drink at least 64 ounces of water daily and follow-up closely with his primary care doctor to have his creatinine rechecked and continue with blood pressure management. Final Clinical Impressions(s) / ED Diagnoses   Final diagnoses:  Syncope, unspecified syncope type  Dehydration  Elevated serum creatinine    ED Discharge Orders    None       Jacqlyn Larsen, [place] 11/29/17 1737    Drenda Freeze, MD 11/29/17 2257

## 2017-11-29 NOTE — ED Notes (Addendum)
Pt ambulated in hall without o2, o2 sat. Lowest at 98 highest 100. Pulse lowest at 87 and highest at 89.

## 2017-11-29 NOTE — ED Notes (Signed)
Pt aware urine sample is needed 

## 2018-06-25 ENCOUNTER — Other Ambulatory Visit: Payer: Self-pay

## 2018-06-26 ENCOUNTER — Ambulatory Visit: Payer: Self-pay | Admitting: Internal Medicine

## 2018-06-26 NOTE — Telephone Encounter (Signed)
Pt is scheduled for virtual visit today

## 2018-06-27 ENCOUNTER — Telehealth: Payer: Self-pay

## 2018-06-27 NOTE — Telephone Encounter (Signed)
lvm for pt to call back due to missing appt yesterday. CRM created

## 2018-07-03 NOTE — Telephone Encounter (Signed)
Pt no showed to appt and have not been able to get in touch with  Since

## 2018-07-14 ENCOUNTER — Ambulatory Visit: Payer: 59 | Admitting: Family Medicine

## 2018-07-21 ENCOUNTER — Telehealth: Payer: Self-pay

## 2018-07-21 NOTE — Telephone Encounter (Signed)
Copied from [place] [phone]. Topic: Referral - Request for Referral >> Jul 21, 2018  9:14 AM Alanda Slim E wrote: Has patient seen PCP for this complaint? No - TeleDoc appt on 6.8.20 *If NO, is insurance requiring patient see PCP for this issue before PCP can refer them? Referral for which specialty: Orthopedist or hand specialist  Preferred provider/office: [facility] in [place], [place]  Reason for referral: severe carpal tunnel

## 2018-07-22 ENCOUNTER — Other Ambulatory Visit: Payer: Self-pay

## 2018-07-22 ENCOUNTER — Telehealth (INDEPENDENT_AMBULATORY_CARE_PROVIDER_SITE_OTHER): Payer: 59 | Admitting: Internal Medicine

## 2018-07-22 ENCOUNTER — Encounter: Payer: Self-pay | Admitting: Internal Medicine

## 2018-07-22 DIAGNOSIS — R200 Anesthesia of skin: Secondary | ICD-10-CM | POA: Diagnosis not present

## 2018-07-22 DIAGNOSIS — Z79899 Other long term (current) drug therapy: Secondary | ICD-10-CM

## 2018-07-22 DIAGNOSIS — I10 Essential (primary) hypertension: Secondary | ICD-10-CM | POA: Diagnosis not present

## 2018-07-22 DIAGNOSIS — R739 Hyperglycemia, unspecified: Secondary | ICD-10-CM

## 2018-07-22 DIAGNOSIS — I639 Cerebral infarction, unspecified: Secondary | ICD-10-CM

## 2018-07-22 DIAGNOSIS — Z720 Tobacco use: Secondary | ICD-10-CM

## 2018-07-22 MED ORDER — HYDROCHLOROTHIAZIDE 25 MG PO TABS: 25.0000 mg | ORAL_TABLET | Freq: Every day | ORAL | 1 refills | Status: DC

## 2018-07-22 NOTE — Assessment & Plan Note (Signed)
On record review   Imaging  Studies are normal inc mro  May have complicated migraine   Uncertain of t his dx

## 2018-07-22 NOTE — Telephone Encounter (Signed)
Called pt and stated he would need a appt for dr.Panosh to get the referral since we have not seen him in a year and dr.Panosh did not see him for this issue but was also suppose to have a visit back in may for Bp

## 2018-07-22 NOTE — Progress Notes (Signed)
Virtual Visit via Video Note  I connected with@ on 07/22/18 at  3:45 PM EDT by a video enabled telemedicine application and verified that I am speaking with the correct person using two identifiers. Location patient: off site work   Environment[employer]  office Persons participating in the virtual visit: patient, provider After multiple attempts camera  Not working   And blocked  So finished as a telephone visit  WIth national recommendations  regarding COVID 19 pandemic   video visit is advised over in office visit for this patient.  Patient aware  of the limitations of evaluation and management by telemedicine and  availability of in person appointments. and agreed to proceed.   HPI: Justin [name] presents for video/phne  Visit  Right hand dominant  With numbness thumb and index ? Middle finger  With weak feeling and can go up to medial elbow/  No injur per se  Ongoing since March  . Feels swollen.  Worse when laying gat night  But if puts over side of bed gets better    No color change  No left sx  HIs insurance nurse advised referral to hand  Evaluation.   bHT needs refill of med on hctz  Last refill last year but was using his moms left over same med . Doesn't have bp monitor at home  thiks ok  Take ocass  advil aleve for HA   Had ed visit for syncope   "got up too fast  " from bed and didn't want to go to ed but went on spouses  Advisement .     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 No cp sob  Eyes blurry some times    Past Medical History:  Diagnosis Date  . Arrhythmia   . Arthritis    Both knees  . Chicken pox    As a child  . High cholesterol   . Hypertension    on meds since 1998   . Left ventricular hypertrophy    card work up [facility] ? inc QRS  . Migraines    Triggered by stress/tension  . OSA (obstructive sleep apnea)     Past Surgical History:  Procedure Laterality Date  . KNEE ARTHROPLASTY Left    left 2013 ligaments     Family History  Problem Relation Age of Onset  . Hypertension Mother   . Other Father        murdered   . Breast cancer Maternal Grandmother   . Breast cancer Maternal Aunt        x 3  . Colon cancer Neg Hx     Social History   Tobacco Use  . Smoking status: Current Every Day Smoker    Packs/day: 0.25    Years: 30.00    Pack years: 7.50    Types: Cigarettes  . Smokeless tobacco: Never Used  . Tobacco comment: tobacco info given 02/24/14  Substance Use Topics  . Alcohol use: Yes    Alcohol/week: 0.0 standard drinks    Comment: rarely  . Drug use: No    Comment: UDS 09/2016, no "current use" noted      Current Outpatient Medications:  .  aspirin EC 81 MG EC tablet, Take 1 tablet (  81 mg total) by mouth daily., Disp: 30 tablet, Rfl: 0 .  diclofenac sodium (VOLTAREN) 1 % GEL, Apply 2 g topically 4 (four) times daily., Disp: 1 Tube, Rfl: 0 .  hydrochlorothiazide (HYDRODIURIL) 25 MG tablet, Take 1 tablet (25 mg total) by mouth daily., Disp: 90 tablet, Rfl: 1 .  naproxen (NAPROSYN) 500 MG tablet, Take 1 tablet (500 mg total) by mouth 2 (two) times daily., Disp: 30 tablet, Rfl: 0 .  traMADol (ULTRAM) 50 MG tablet, Take 1 tablet (50 mg total) by mouth every 6 (six) hours as needed., Disp: 10 tablet, Rfl: 0  EXAM: BP Readings from Last 3 Encounters:  11/29/17 119/84  11/19/17 (!) 137/94  09/12/17 (!) 129/93    VITALS per patient if applicable: no data  Today  Record review   PSYCH/NEURO: pleasant and cooperative, no obvious depression or anxiety, speech and thought processing grossly intact Lab Results  Component Value Date   WBC 11.9 (H) 11/29/2017   HGB 15.9 11/29/2017   HCT 49.4 11/29/2017   PLT 297 11/29/2017   GLUCOSE 125 (H) 11/29/2017   CHOL 160 10/01/2016   TRIG 72 10/01/2016   HDL 41  10/01/2016   LDLCALC 105 (H) 10/01/2016   ALT 23 09/30/2016   AST 24 09/30/2016   NA 140 11/29/2017   K 3.8 11/29/2017   CL 105 11/29/2017   CREATININE 1.43 (H) 11/29/2017   BUN 22 (H) 11/29/2017   CO2 26 11/29/2017   INR 1.05 09/30/2016   HGBA1C 5.6 10/01/2016    ASSESSMENT AND PLAN:  Discussed the following assessment and plan:    ICD-10-CM   1. Finger numbness  J19.1 Basic metabolic panel    Hemoglobin A1c    Lipid panel    Hepatic function panel    CBC with Differential/Platelet    Ambulatory referral to Hand Surgery  2. Essential hypertension  Y78 Basic metabolic panel    Hemoglobin A1c    Lipid panel    Hepatic function panel    CBC with Differential/Platelet    Protein / creatinine ratio, urine  3. Medication management  G95.621 Basic metabolic panel    Hemoglobin A1c    Lipid panel    Hepatic function panel    CBC with Differential/Platelet    Protein / creatinine ratio, urine  4. Hyperglycemia  H08.6 Basic metabolic panel    Hemoglobin A1c    Lipid panel    Hepatic function panel    CBC with Differential/Platelet  5. Numbness of right hand  R20.0 Ambulatory referral to Hand Surgery  6. Acute ischemic stroke (HCC) - TIA:  Possible right thalamic/IC infarct, secondary to small vessel disease source. Risk factor including HTN, HLD, cocaine use, OSA, smoking  I63.9   7. Tobacco use  Z72.0     Counseled.   Get a bp monitor   Will refill med Lab appt for next week to recheck his  Creatinine and bg etc .  Avoid advil aleve products cause of renal function  Status  Stop tobacco  Help for vascular health   Referral for hand predicament  ? Compression neuropathy based on hx   He does a physical labor job   Had ct of neck last year  Some djd but no sig narrowing neuro  compression n Fu depending     Expectant management and discussion of plan and treatment with opportunity to ask questions and all were answered. The patient agreed with the plan and demonstrated an  understanding of  the instructions.   Advised to call back or seek an in-person evaluation if worsening  or having  further concerns . If worse in interim   I provided  minutes of non-face-to-face time during this encounter.   Shanon Ace, MD

## 2018-07-23 ENCOUNTER — Ambulatory Visit: Payer: 59 | Admitting: Internal Medicine

## 2018-07-25 NOTE — Telephone Encounter (Signed)
Patient calling to check status of referral. Requesting a call back from [place].

## 2018-07-28 ENCOUNTER — Other Ambulatory Visit (INDEPENDENT_AMBULATORY_CARE_PROVIDER_SITE_OTHER): Payer: 59

## 2018-07-28 ENCOUNTER — Other Ambulatory Visit: Payer: Self-pay

## 2018-07-28 DIAGNOSIS — R739 Hyperglycemia, unspecified: Secondary | ICD-10-CM | POA: Diagnosis not present

## 2018-07-28 DIAGNOSIS — I10 Essential (primary) hypertension: Secondary | ICD-10-CM | POA: Diagnosis not present

## 2018-07-28 DIAGNOSIS — Z79899 Other long term (current) drug therapy: Secondary | ICD-10-CM | POA: Diagnosis not present

## 2018-07-28 DIAGNOSIS — R200 Anesthesia of skin: Secondary | ICD-10-CM

## 2018-07-28 LAB — CBC WITH DIFFERENTIAL/PLATELET
Basophils Absolute: 0 10*3/uL (ref 0.0–0.1)
Basophils Relative: 0.2 % (ref 0.0–3.0)
Eosinophils Absolute: 0.1 10*3/uL (ref 0.0–0.7)
Eosinophils Relative: 0.8 % (ref 0.0–5.0)
HCT: 45.6 % (ref 39.0–52.0)
Hemoglobin: 15.3 g/dL (ref 13.0–17.0)
Lymphocytes Relative: 28.3 % (ref 12.0–46.0)
Lymphs Abs: 2.5 10*3/uL (ref 0.7–4.0)
MCHC: 33.6 g/dL (ref 30.0–36.0)
MCV: 87.3 fl (ref 78.0–100.0)
Monocytes Absolute: 0.8 10*3/uL (ref 0.1–1.0)
Monocytes Relative: 8.8 % (ref 3.0–12.0)
Neutro Abs: 5.5 10*3/uL (ref 1.4–7.7)
Neutrophils Relative %: 61.9 % (ref 43.0–77.0)
Platelets: 265 10*3/uL (ref 150.0–400.0)
RBC: 5.22 Mil/uL (ref 4.22–5.81)
RDW: 14.8 % (ref 11.5–15.5)
WBC: 8.9 10*3/uL (ref 4.0–10.5)

## 2018-07-28 LAB — BASIC METABOLIC PANEL
BUN: 16 mg/dL (ref 6–23)
CO2: 25 mEq/L (ref 19–32)
Calcium: 9.3 mg/dL (ref 8.4–10.5)
Chloride: 105 mEq/L (ref 96–112)
Creatinine, Ser: 1.21 mg/dL (ref 0.40–1.50)
GFR: 77.25 mL/min (ref >60.00)
Glucose, Bld: 112 mg/dL — ABNORMAL HIGH (ref 70–99)
Potassium: 4 mEq/L (ref 3.5–5.1)
Sodium: 138 mEq/L (ref 135–145)

## 2018-07-28 LAB — HEPATIC FUNCTION PANEL
ALT: 19 U/L (ref 0–53)
AST: 18 U/L (ref 0–37)
Albumin: 4.2 g/dL (ref 3.5–5.2)
Alkaline Phosphatase: 69 U/L (ref 39–117)
Bilirubin, Direct: 0.1 mg/dL (ref 0.0–0.3)
Total Bilirubin: 0.5 mg/dL (ref 0.2–1.2)
Total Protein: 6.8 g/dL (ref 6.0–8.3)

## 2018-07-28 LAB — HEMOGLOBIN A1C: Hgb A1c MFr Bld: 6.2 % (ref 4.6–6.5)

## 2018-07-28 LAB — LIPID PANEL
Cholesterol: 208 mg/dL — ABNORMAL HIGH (ref 0–200)
HDL: 44.4 mg/dL (ref >39.00)
LDL Cholesterol: 143 mg/dL — ABNORMAL HIGH (ref 0–99)
NonHDL: 163.19
Total CHOL/HDL Ratio: 5
Triglycerides: 101 mg/dL (ref 0.0–149.0)
VLDL: 20.2 mg/dL (ref 0.0–40.0)

## 2018-07-28 NOTE — Telephone Encounter (Signed)
Pt scheduled for 07/29/2018@8 :15 am pt is aware of his scheduled appt  [facility]   Address: [address] [place], [place] [address]  [phone] Fax [phone]

## 2018-07-29 DIAGNOSIS — M79641 Pain in right hand: Secondary | ICD-10-CM | POA: Insufficient documentation

## 2018-07-29 LAB — PROTEIN / CREATININE RATIO, URINE
Creatinine, Urine: 177 mg/dL (ref 20–320)
Protein/Creat Ratio: 56 mg/g creat (ref 22–128)
Protein/Creatinine Ratio: 0.056 mg/mg creat (ref 0.022–0.12)
Total Protein, Urine: 10 mg/dL (ref 5–25)

## 2018-07-29 LAB — EXTRA URINE SPECIMEN

## 2018-08-07 ENCOUNTER — Other Ambulatory Visit: Payer: Self-pay

## 2018-08-07 DIAGNOSIS — M79641 Pain in right hand: Secondary | ICD-10-CM

## 2018-08-26 ENCOUNTER — Ambulatory Visit (INDEPENDENT_AMBULATORY_CARE_PROVIDER_SITE_OTHER): Payer: 59 | Admitting: Neurology

## 2018-08-26 ENCOUNTER — Other Ambulatory Visit: Payer: Self-pay

## 2018-08-26 DIAGNOSIS — G5601 Carpal tunnel syndrome, right upper limb: Secondary | ICD-10-CM

## 2018-08-26 DIAGNOSIS — G5621 Lesion of ulnar nerve, right upper limb: Secondary | ICD-10-CM | POA: Diagnosis not present

## 2018-08-26 NOTE — Procedures (Signed)
[facility]  [address]  [place], [place] [address] Tel: [phone] Fax:  [phone] Test Date:  08/26/2018  Patient: Justin [name] DOB: 1970/12/29 Physician: Narda Amber, DO  Sex: Male Height: 5\' 8"  Ref Phys: Charlotte Crumb, MD  ID#: [id number] Temp: 34.0C Technician:    Patient Complaints: This is a 48 year old man referred for evaluation of right hand tingling, burning, and weakness.  NCV & EMG Findings: Extensive electrodiagnostic testing of the right upper extremity and additional studies of the left shows:  1. Right median sensory response is absent.  Left median and bilateral ulnar sensory responses are within normal limits. 2. Right median motor response shows prolonged distal onset latency (5.1 ms) and reduced amplitude (5.9 mV).  Left median motor responses within normal limits.  Of note, there is evidence of bilateral Martin-Gruber anastomosis as seen by a greater proximal median amplitude and a motor response at the ulnar-wrist recording at the abductor pollicis brevis muscle.  Right ulnar motor response shows mildly slowed conduction velocity across the elbow (A Elbow-B Elbow, 48 m/s).  Left ulnar motor responses within normal limits.   3. Chronic motor axonal loss changes are isolated to the right abductor pollicis brevis muscle, without accompanied active denervation.    Impression: 1. 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 2. 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 3. Incidentally, there is evidence of bilateral Martin-Gruber anastomosis, a normal anatomic variant. 4. There is no evidence of a right cervical radiculopathy.   ___________________________ Narda Amber, DO    Nerve Conduction Studies Anti Sensory Summary Table   Site NR Peak (ms) Norm Peak (ms) P-T Amp (V) Norm P-T Amp  Left  Median Anti Sensory (2nd Digit)  34C  Wrist    3.3 <3.4 25.5 >20  Right Median Anti Sensory (2nd Digit)  34C  Wrist NR  <3.4  >20  Left Ulnar Anti Sensory (5th Digit)  34C  Wrist    3.0 <3.1 12.5 >12  Right Ulnar Anti Sensory (5th Digit)  34C  Wrist    2.8 <3.1 16.2 >12   Motor Summary Table   Site NR Onset (ms) Norm Onset (ms) O-P Amp (mV) Norm O-P Amp Site1 Site2 Delta-0 (ms) Dist (cm) Vel (m/s) Norm Vel (m/s)  Left Median Motor (Abd Poll Brev)  34C  Wrist    3.5 <3.9 6.7 >6 Elbow Wrist 4.9 30.0 61 >50  Elbow    8.4  5.9  Ulnar-wrist crossover Elbow 3.7 0.0    Ulnar-wrist crossover    4.7  2.6         Right Median Motor (Abd Poll Brev)  34C  Wrist    5.1 <3.9 5.9 >6 Elbow Wrist 5.2 31.0 60 >50  Elbow    10.3  7.8  Ulnar-wrist crossover Elbow 5.9 0.0    Ulnar-wrist crossover    4.4  3.5         Left Ulnar Motor (Abd Dig Minimi)  34C  Wrist    1.9 <3.1 7.3 >7 B Elbow Wrist 4.2 24.0 57 >50  B Elbow    6.1  6.7  A Elbow B Elbow 1.9 10.0 53 >50  A Elbow    8.0  6.2         Right Ulnar Motor (Abd Dig Minimi)  34C  Wrist    2.1 <3.1 8.3 >7 B Elbow  Wrist 3.8 24.0 63 >50  B Elbow    5.9  7.5  A Elbow B Elbow 2.1 10.0 48 >50  A Elbow    8.0  7.3          Comparison Summary Table   Site NR Peak (ms) Norm Peak (ms) P-T Amp (V) Site1 Site2 Delta-P (ms) Norm Delta (ms)  Left Median/Ulnar Palm Comparison (Wrist - 8cm)  34C  Median Palm    2.0 <2.2 27.2 Median Palm Ulnar Palm 0.1   Ulnar Palm    1.9 <2.2 8.2       EMG   Side Muscle Ins Act Fibs Psw Fasc Number Recrt Dur Dur. Amp Amp. Poly Poly. Comment  Right 1stDorInt Nml Nml Nml Nml Nml Nml Nml Nml Nml Nml Nml Nml N/A  Right Ext Indicis Nml Nml Nml Nml Nml Nml Nml Nml Nml Nml Nml Nml N/A  Right ABD Dig Min Nml Nml Nml Nml Nml Nml Nml Nml Nml Nml Nml Nml N/A  Right FlexCarpiUln Nml Nml Nml Nml Nml Nml Nml Nml Nml Nml Nml Nml N/A  Right Abd Poll Brev Nml Nml Nml Nml 1- Rapid Some 1+ Some 1+ Nml Nml N/A  Right Biceps Nml Nml  Nml Nml Nml Nml Nml Nml Nml Nml Nml Nml N/A  Right Triceps Nml Nml Nml Nml Nml Nml Nml Nml Nml Nml Nml Nml N/A  Right Deltoid Nml Nml Nml Nml Nml Nml Nml Nml Nml Nml Nml Nml N/A      Waveforms:

## 2018-09-05 ENCOUNTER — Telehealth: Payer: Self-pay | Admitting: Internal Medicine

## 2018-09-05 NOTE — Telephone Encounter (Signed)
Sheena informed me on 7-30/2020  with this message from the patient she gave me a copy of the message from the patient.  " Hi, Dr. Regis Bill im needing the referral that you sent to Dr. Glenna Fellows ( Hand specialist  Sent to [facility] claims department !!  The fax number is  [phone] ".  Sheena informed me that authorization is needed for the patient for his visit with Neurology. She asked me about the authorization of why it wasn't completed.  I told her that I was not aware that the patient needed a referral authorization. That the patient has to inform our office if this is required, I also told Vita Barley that the [facility] would place the referral. If approval is needed, it will say on the referral that it's for an insurance purpose and authorization. And at times some of the CMA would put this information on the referral . Sheena asked if I look at [employer] cards  I informed her No not all the time only if a referral is required .I went on the portal.  I proceed with the authorization on the [facility]  portal.  It stated the wrong DOB for the patient. I called [facility]. The DOB was corrected, and the [facility] agent informed me NO authorization is required for the patient, And I also went back on the portal to initiate the referral got a message saying not needed. I called the patient to find out what exactly was he needed. I read to him the message we receive. Informed by the patient, all that he was needed was a copy of both of his referral to [facility], and he would pick it up from our office on Monday because he was out of town. I informed Vita Barley that the pt did not ask for authorization as she mentioned to me that he just wanted a copy of his referral due to disability reason referral printed and awaiting patient to pick it up.

## 2018-09-09 ENCOUNTER — Encounter: Payer: 59 | Admitting: Neurology

## 2018-12-25 ENCOUNTER — Emergency Department (HOSPITAL_BASED_OUTPATIENT_CLINIC_OR_DEPARTMENT_OTHER)
Admission: EM | Admit: 2018-12-25 | Discharge: 2018-12-25 | Disposition: A | Discharge status: Home / Self Care | Payer: 59 | Attending: Emergency Medicine | Admitting: Emergency Medicine

## 2018-12-25 ENCOUNTER — Other Ambulatory Visit: Payer: Self-pay

## 2018-12-25 ENCOUNTER — Encounter (HOSPITAL_BASED_OUTPATIENT_CLINIC_OR_DEPARTMENT_OTHER): Payer: Self-pay | Admitting: Emergency Medicine

## 2018-12-25 DIAGNOSIS — M5432 Sciatica, left side: Secondary | ICD-10-CM | POA: Insufficient documentation

## 2018-12-25 DIAGNOSIS — I10 Essential (primary) hypertension: Secondary | ICD-10-CM | POA: Insufficient documentation

## 2018-12-25 DIAGNOSIS — Z7982 Long term (current) use of aspirin: Secondary | ICD-10-CM | POA: Insufficient documentation

## 2018-12-25 DIAGNOSIS — E782 Mixed hyperlipidemia: Secondary | ICD-10-CM | POA: Insufficient documentation

## 2018-12-25 DIAGNOSIS — F17210 Nicotine dependence, cigarettes, uncomplicated: Secondary | ICD-10-CM | POA: Insufficient documentation

## 2018-12-25 DIAGNOSIS — Z79899 Other long term (current) drug therapy: Secondary | ICD-10-CM | POA: Insufficient documentation

## 2018-12-25 DIAGNOSIS — Z880 Allergy status to penicillin: Secondary | ICD-10-CM | POA: Insufficient documentation

## 2018-12-25 MED ORDER — OXYCODONE-ACETAMINOPHEN 5-325 MG PO TABS: 1.0000 | ORAL_TABLET | Freq: Four times a day (QID) | ORAL | 0 refills | Status: AC | PRN

## 2018-12-25 MED ORDER — KETOROLAC TROMETHAMINE 15 MG/ML IJ SOLN
15.0000 mg | Freq: Once | INTRAMUSCULAR | Status: AC
  Administered 2018-12-25: 15 mg via INTRAMUSCULAR
  Filled 2018-12-25: qty 1

## 2018-12-25 NOTE — ED Notes (Signed)
ED Provider at bedside. 

## 2018-12-25 NOTE — ED Provider Notes (Addendum)
[place] DEPT [facility] Provider Note: Georgena Spurling, MD, FACEP  CSN: [id number] MRN: [id number] ARRIVAL: 12/25/18 at [place]: [place]  Hip Pain   HISTORY OF PRESENT ILLNESS  12/25/18 2:38 AM Justin [name] is a 48 y.o. male who complains of left buttock pain radiating down the back of his left leg.  The pain has been occurring intermittently for about the past week.  He attributes this to helping his mother move recently.  He has been taking over-the-counter Tylenol, ibuprofen naproxen sodium without adequate relief.  He currently rates his pain as an 8 out of 10, worse with attempted movement of the left hip.  He has had relief by soaking in a warm tub but then certain movements will reexacerbate his pain.  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  He has had no bowel or bladder changes.    Past Medical History:  Diagnosis Date  . Arrhythmia   . Arthritis    Both knees  . Chicken pox    As a child  . High cholesterol   . Hypertension    on meds since 1998   . Left ventricular hypertrophy    card work up [facility] ? inc QRS  . Migraines    Triggered by stress/tension  . OSA (obstructive sleep apnea)     Past Surgical History:  Procedure Laterality Date  . CARPAL TUNNEL RELEASE    . KNEE ARTHROPLASTY Left    left 2013 ligaments     Family History  Problem Relation Age of Onset  . Hypertension Mother   . Other Father        murdered   . Breast cancer Maternal Grandmother   . Breast cancer Maternal Aunt        x 3  . Colon cancer Neg Hx     Social History   Tobacco Use  . Smoking status: Current Every Day Smoker    Packs/day: 0.25    Years: 30.00    Pack years: 7.50    Types: Cigarettes  . Smokeless tobacco: Never Used  . Tobacco comment: tobacco info given 02/24/14  Substance Use Topics  . Alcohol use: Yes    Alcohol/week: 0.0  standard drinks    Comment: rarely  . Drug use: No    Comment: UDS 09/2016, no "current use" noted    Prior to Admission medications   Medication Sig Start Date End Date Taking? Authorizing Provider  aspirin EC 81 MG EC tablet Take 1 tablet (81 mg total) by mouth daily. 10/03/16   Patteson, Arlan Organ, NP  diclofenac sodium (VOLTAREN) 1 % GEL Apply 2 g topically 4 (four) times daily. 09/12/17   Khatri, Hina, PA-C  hydrochlorothiazide (HYDRODIURIL) 25 MG tablet Take 1 tablet (25 mg total) by mouth daily. 07/22/18   Panosh, Standley Brooking, MD  naproxen (NAPROSYN) 500 MG tablet Take 1 tablet (500 mg total) by mouth 2 (two) times daily. 11/19/17   Maczis, Barth Kirks, PA-C  traMADol (ULTRAM) 50 MG tablet Take 1 tablet (50 mg total) by mouth every 6 (six) hours as needed. 09/12/17   Khatri, Hina, PA-C    Allergies Penicillins   REVIEW OF SYSTEMS  Negative except as noted here or in the History of Present Illness.   PHYSICAL EXAMINATION  Initial Vital Signs Blood pressure (!) 199/103, pulse 88, temperature 98.6 F (37 C),  temperature source Oral, resp. rate 20, SpO2 99 %.  Examination General: Well-developed, well-nourished male in no acute distress; appearance consistent with age of record HENT: normocephalic; atraumatic Eyes: Normal appearance Neck: supple Heart: regular rate and rhythm Lungs: clear to auscultation bilaterally Abdomen: soft; nondistended; nontender; bowel sounds present Back: Left sciatic notch tenderness; negative straight leg raise on the right; positive straight leg raise on the left 10 degrees Extremities: No deformity; pulses normal; no edema; decreased range of motion at left hip Neurologic: Awake, alert and oriented; motor function intact in all extremities and symmetric except weakness of dorsiflexion on the left foot; sensation intact and symmetric in the lower extremities; no facial droop Skin: Warm and dry Psychiatric: Normal mood and affect   RESULTS  Summary of  this visit's results, reviewed and interpreted by myself:   EKG Interpretation  Date/Time:    Ventricular Rate:    PR Interval:    QRS Duration:   QT Interval:    QTC Calculation:   R Axis:     Text Interpretation:        Laboratory Studies: No results found for this or any previous visit (from the past 24 hour(s)). Imaging Studies: No results found.  ED COURSE and MDM  Nursing notes, initial and subsequent vitals signs, including pulse oximetry, reviewed and interpreted by myself.  Vitals:   12/25/18 0236  BP: (!) 199/103  Pulse: 88  Resp: 20  Temp: 98.6 F (37 C)  TempSrc: Oral  SpO2: 99%   Medications  ketorolac (TORADOL) 15 MG/ML injection 15 mg (has no administration in time range)    History and examination consistent with sciatica on the left.  We will treat his pain and refer to neurosurgery.  Patient's blood pressure was noted to be elevated even though he is on an antihypertensive.  This may be due to his acute pain.  He was advised to follow-up with his PCP for long-term management of his blood pressure.  PROCEDURES  Procedures   ED DIAGNOSES     ICD-10-CM   1. Sciatica of left side  M54.32   2. Hypertension not at goal  [facility], Jenny Reichmann, MD 12/25/18 0248    Shanon Rosser, MD 12/25/18 [phone]

## 2018-12-25 NOTE — ED Triage Notes (Signed)
Pt c/o left hip x 1 week and acutely worse tonight. Pt reports pain down left leg.

## 2019-01-13 ENCOUNTER — Other Ambulatory Visit: Payer: Self-pay

## 2019-01-13 DIAGNOSIS — Z20822 Contact with and (suspected) exposure to covid-19: Secondary | ICD-10-CM

## 2019-01-14 MED ORDER — HYDROCHLOROTHIAZIDE 25 MG PO TABS: 25.0000 mg | ORAL_TABLET | Freq: Every day | ORAL | 1 refills | Status: AC

## 2019-01-15 LAB — NOVEL CORONAVIRUS, NAA: SARS-CoV-2, NAA: NOT DETECTED

## 2019-02-03 ENCOUNTER — Ambulatory Visit: Payer: Self-pay | Attending: Internal Medicine

## 2019-02-03 DIAGNOSIS — R238 Other skin changes: Secondary | ICD-10-CM | POA: Insufficient documentation

## 2019-02-03 DIAGNOSIS — Z20828 Contact with and (suspected) exposure to other viral communicable diseases: Secondary | ICD-10-CM | POA: Insufficient documentation

## 2019-02-03 DIAGNOSIS — U071 COVID-19: Secondary | ICD-10-CM

## 2019-02-04 LAB — NOVEL CORONAVIRUS, NAA: SARS-CoV-2, NAA: NOT DETECTED

## 2019-02-10 ENCOUNTER — Telehealth: Payer: Self-pay

## 2019-02-10 NOTE — Telephone Encounter (Signed)
Copied from [place] [id number]. Topic: General - Inquiry >> Feb 10, 2019  1:46 PM Greggory Keen D wrote: Reason for CRM: Pt took a Covid text on Dec 28 because he had Covid symptoms of fever and cough, congestion ect.  His worked ask him to get the test.  The test came back negative on Dec 30th and he called work to let them know it was negative.  They told him he could not come back to work until his symptoms cleared up/  Now he needs a Dr's note to come to work.  CB#  [phone]

## 2019-02-10 NOTE — Telephone Encounter (Signed)
Pt has been made an appt

## 2019-02-12 ENCOUNTER — Telehealth (INDEPENDENT_AMBULATORY_CARE_PROVIDER_SITE_OTHER): Payer: Self-pay | Admitting: Internal Medicine

## 2019-02-12 ENCOUNTER — Other Ambulatory Visit: Payer: Self-pay

## 2019-02-12 ENCOUNTER — Encounter: Payer: Self-pay | Admitting: Internal Medicine

## 2019-02-12 DIAGNOSIS — Z03818 Encounter for observation for suspected exposure to other biological agents ruled out: Secondary | ICD-10-CM

## 2019-02-12 DIAGNOSIS — Z20822 Contact with and (suspected) exposure to covid-19: Secondary | ICD-10-CM

## 2019-02-12 DIAGNOSIS — I10 Essential (primary) hypertension: Secondary | ICD-10-CM

## 2019-02-12 DIAGNOSIS — J22 Unspecified acute lower respiratory infection: Secondary | ICD-10-CM

## 2019-02-12 NOTE — Progress Notes (Signed)
Virtual Visit via Video Note  I connected with@ on 02/12/19 at  9:30 AM EST by a video enabled telemedicine application and verified that I am speaking with the correct person using two identifiers. Location patient: home Location provider: home office Persons participating in the virtual visit: patient, provider  WIth national recommendations  regarding COVID 19 pandemic   video visit is advised over in office visit for this patient.  Patient aware  of the limitations of evaluation and management by telemedicine and  availability of in person appointments. and agreed to proceed.   HPI: Justin [name] presents for video visit because he requires a note for his workplace for his absence from December 23-31. He had which was like a cold and a chest cold without associated fever and got tested for Covid and stayed home from work.  Neg covid  On 12 8  Test abd 12 29 since that time he is significantly improved just has some mild congestion occasional cough His whole symptom complex has been probably about 2 weeks. He needs a note confirming that he needed to be out the dates above. Of note he was also tested December 8 after a possible exposure but was negative and family has been negative.  And never developed any symptoms.   And other matters asked about his blood pressure states that is been really quite good in the low 100s over 83 taking his medicine daily.  And not concerned.   ROS: See pertinent positives and negatives per HPI. No cp sob fever   Past Medical History:  Diagnosis Date  . Arrhythmia   . Arthritis    Both knees  . Chicken pox    As a child  . High cholesterol   . Hypertension    on meds since 1998   . Left ventricular hypertrophy    card work up [facility] ? inc QRS  . Migraines    Triggered by stress/tension  . OSA (obstructive sleep apnea)     Past Surgical History:  Procedure Laterality Date  . CARPAL TUNNEL RELEASE    . KNEE ARTHROPLASTY Left    left 2013 ligaments     Family History  Problem Relation Age of Onset  . Hypertension Mother   . Other Father        murdered   . Breast cancer Maternal Grandmother   . Breast cancer Maternal Aunt        x 3  . Colon cancer Neg Hx      Current Outpatient Medications:  .  aspirin EC 81 MG EC tablet, Take 1 tablet (81 mg total) by mouth daily., Disp: 30 tablet, Rfl: 0 .  diclofenac sodium (VOLTAREN) 1 % GEL, Apply 2 g topically 4 (four) times daily., Disp: 1 Tube, Rfl: 0 .  hydrochlorothiazide (HYDRODIURIL) 25 MG tablet, Take 1 tablet (25 mg total) by mouth daily., Disp: 90 tablet, Rfl: 1 .  oxyCODONE-acetaminophen (PERCOCET) 5-325 MG tablet, Take 1-2 tablets by mouth every 6 (six) hours as needed for severe pain., Disp: 30 tablet, Rfl: 0  EXAM: BP Readings from Last 3 Encounters:  12/25/18 (!) 189/90  11/29/17 119/84  11/19/17 (!) 137/94    VITALS per patient if applicable: reported [id number] ? At home   GENERAL: alert, oriented, appears well and in no acute distress  HEENT: atraumatic, conjunttiva clear, no obvious abnormalities on inspection of external nose and ears min resp congestion  NECK: normal movements of the head and neck  LUNGS: on  inspection no signs of respiratory distress, breathing rate appears normal, no obvious gross SOB, gasping or wheezing  CV: no obvious cyanosis PSYCH/NEURO: pleasant and cooperative, no obvious depression or anxiety, speech and thought processing grossly intact   ASSESSMENT AND PLAN:  Discussed the following assessment and plan:    ICD-10-CM   1. Acute respiratory infection  J22   2. Lab test negative for COVID-19 virus  Z03.818   3. Essential hypertension  I10    reported controlled    Note for work  To be sent  To my chart   Counseled.   Expectant management and discussion of plan and treatment with opportunity to ask questions and all were answered. The patient agreed with the plan and demonstrated an understanding of the  instructions.   Advised to call back or seek an in-person evaluation if worsening  or having  further concerns . Return for make cpx appt ( lab orders in system).   Outside external source  DATA REVIEWED:   Testing in record    Total time on date  of service including record review ordering and plan of care:  Composing notes  And plan for follow up . 31 minutes  Shanon Ace, MD

## 2019-11-29 IMAGING — CT CT CERVICAL SPINE W/O CM
4 of 7 series · 13 of 33 positions shown, 14 images · non-contrast
Comparison: MRI 10/01/2016

CLINICAL DATA: Neck pain.  Blurred vision in right eye.  Dizziness.

EXAM:
CT HEAD WITHOUT CONTRAST
CT CERVICAL SPINE WITHOUT CONTRAST
TECHNIQUE: Multidetector CT imaging of the head and cervical spine was
performed following the standard protocol without intravenous
contrast. Multiplanar CT image reconstructions of the cervical spine
were also generated.

[Series 7: c_spine 2.0 i30s 3 · axial · 0.29mm/px · z∈[-301,-199]mm · 4 of 87 slices shown, 5 images]
[im 18/87  soft-tissue]
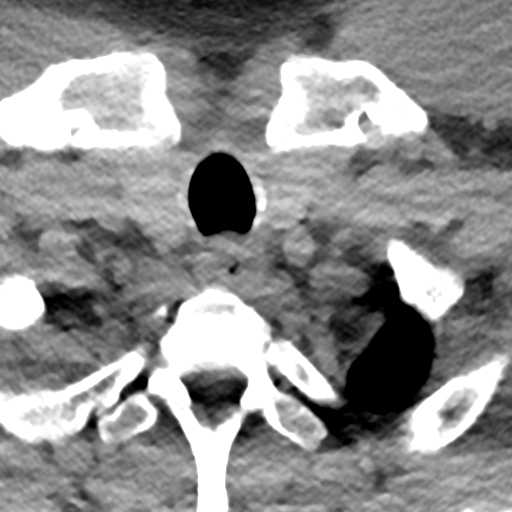
[im 18/87  bone]
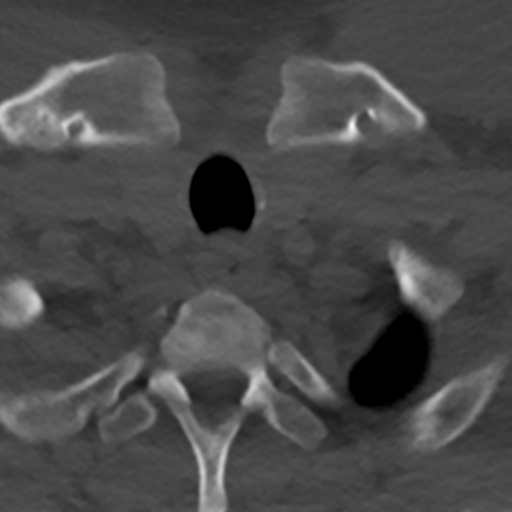
[im 35/87  bone]
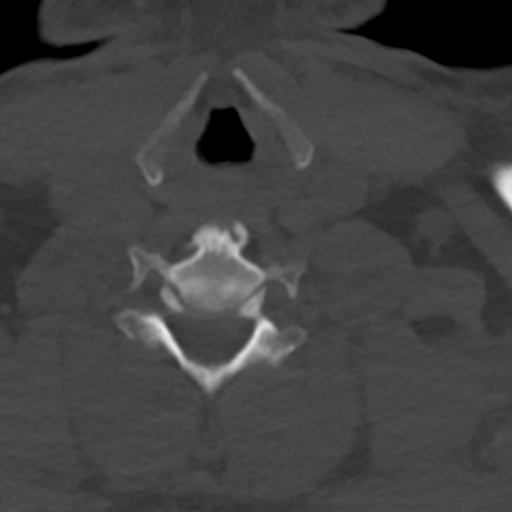
[im 52/87  bone]
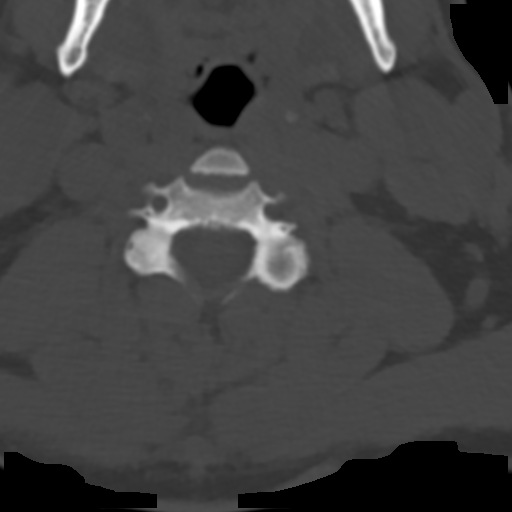
[im 69/87  bone]
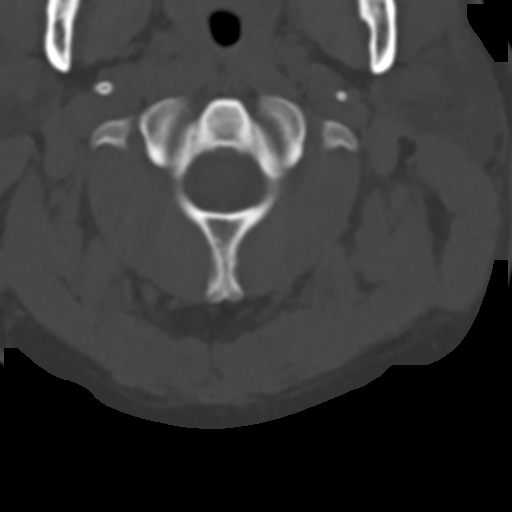

[Series 9: coronals · coronal · 0.27mm/px · 1 of 65 slices shown]
[im 33/65  bone]
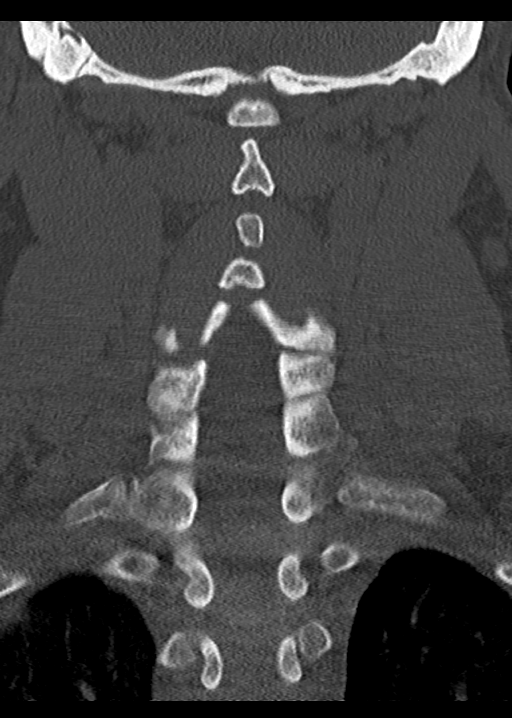

[Series 10: sagittals · sagittal · 0.25mm/px · 4 of 54 slices shown]
[im 11/54  bone]
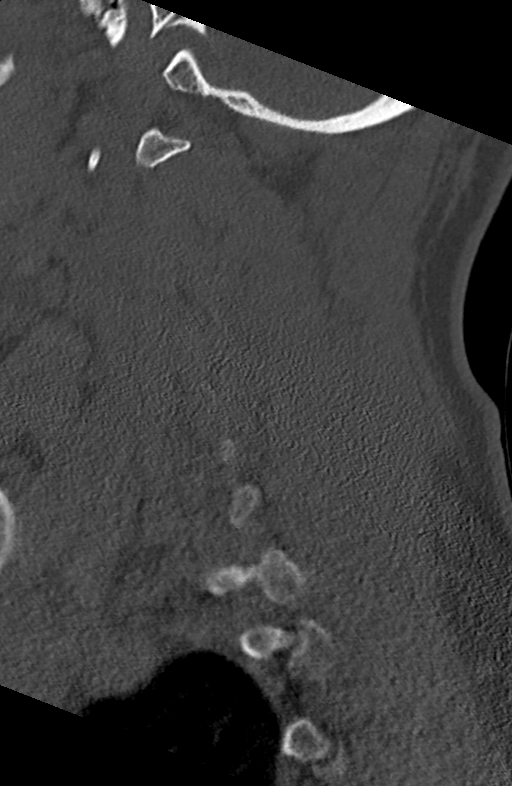
[im 22/54  bone]
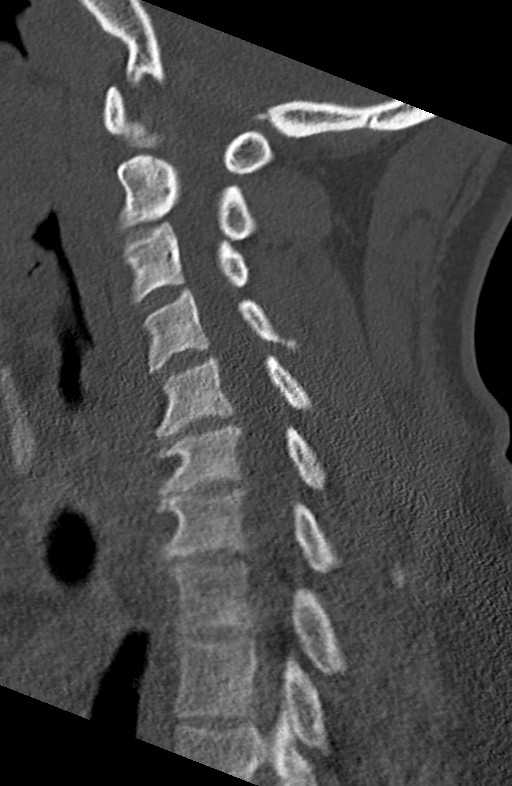
[im 32/54  bone]
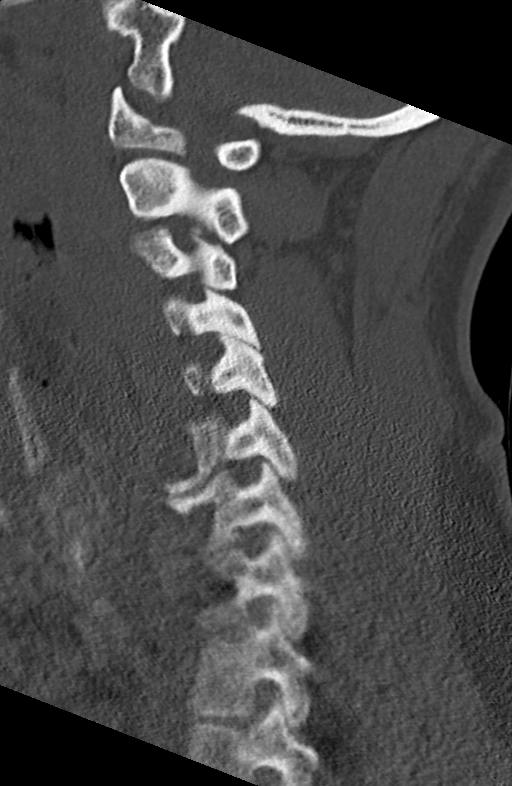
[im 43/54  bone]
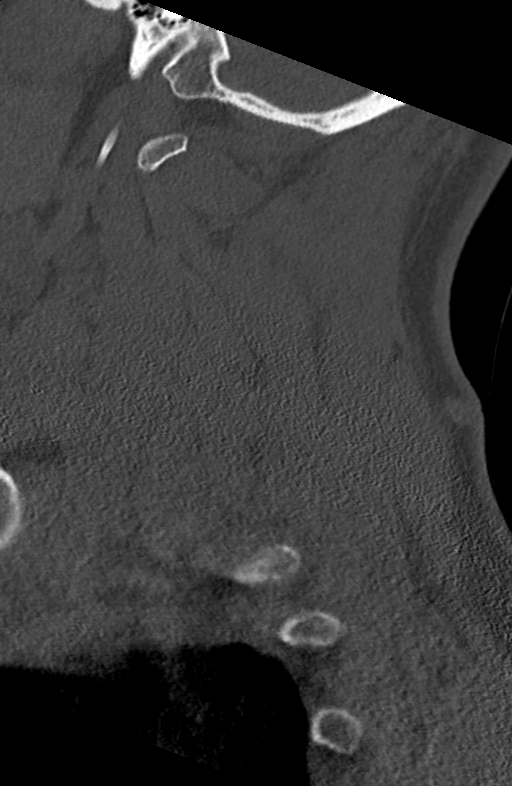

[Series 11: orthogonals · axial · 0.23mm/px · z∈[-323,-217]mm · 4 of 96 slices shown]
[im 20/96  bone]
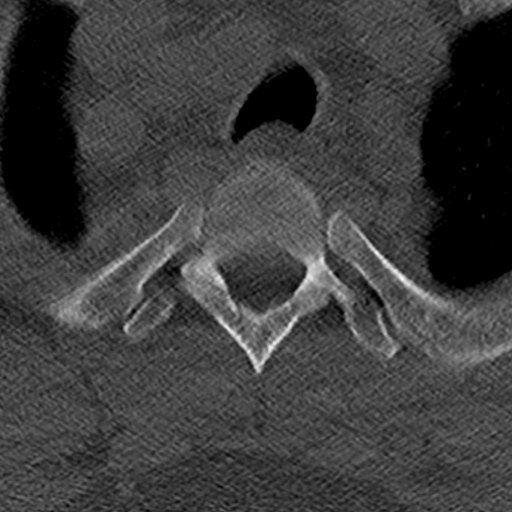
[im 39/96  bone]
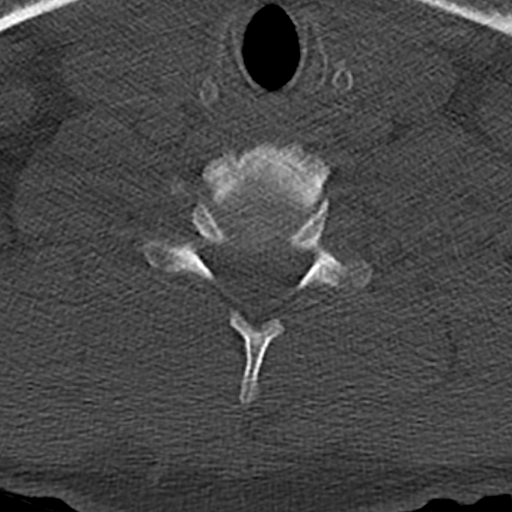
[im 58/96  bone]
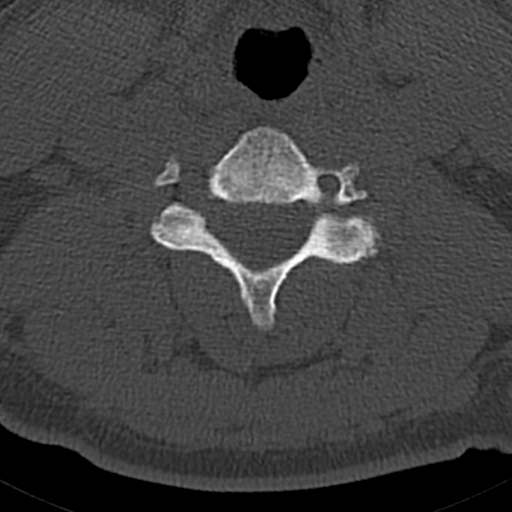
[im 77/96  bone]
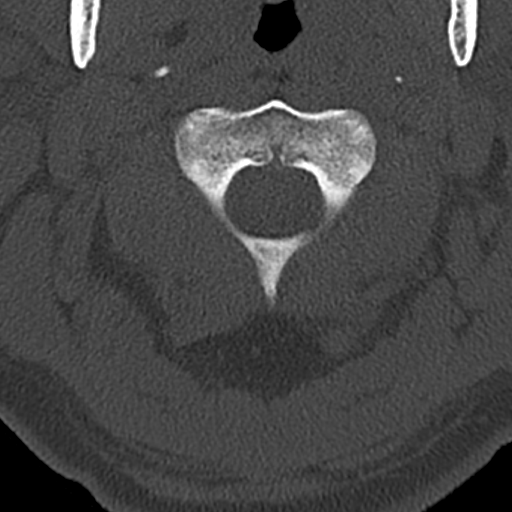

[13 of 33 positions shown; findings below may reference images not displayed]

FINDINGS: CT HEAD FINDINGS

Brain: No acute intracranial abnormality. Specifically, no
hemorrhage, hydrocephalus, mass lesion, acute infarction, or
significant intracranial injury.

Vascular: No hyperdense vessel or unexpected calcification.

Skull: No acute calvarial abnormality.

Sinuses/Orbits: Visualized paranasal sinuses and mastoids clear. Old
left orbital floor fracture with herniated fat. No evidence of
entrapment.

Other: None

CT CERVICAL SPINE FINDINGS

Alignment: No subluxation.

Skull base and vertebrae: No fracture. No primary bone lesion or
focal pathologic process.

Soft tissues and spinal canal: No prevertebral fluid or swelling. No
visible canal hematoma.

Disc levels: Disc space narrowing and spurring at C5-6 and C6-7. No
significant neural foraminal narrowing.

Upper chest: No acute findings

Other: None
IMPRESSION: No intracranial abnormality.

No acute bony abnormality in the cervical spine. Degenerative disc
disease changes at C5-6 and C6-7.

## 2023-01-25 ENCOUNTER — Emergency Department (HOSPITAL_COMMUNITY)

## 2023-01-25 ENCOUNTER — Encounter (HOSPITAL_COMMUNITY): Payer: Self-pay | Admitting: *Deleted

## 2023-01-25 ENCOUNTER — Other Ambulatory Visit: Payer: Self-pay

## 2023-01-25 ENCOUNTER — Emergency Department (HOSPITAL_COMMUNITY)
Admission: EM | Admit: 2023-01-25 | Discharge: 2023-01-25 | Disposition: A | Discharge status: Home / Self Care | Attending: Emergency Medicine | Admitting: Emergency Medicine

## 2023-01-25 DIAGNOSIS — R1011 Right upper quadrant pain: Secondary | ICD-10-CM | POA: Insufficient documentation

## 2023-01-25 DIAGNOSIS — R112 Nausea with vomiting, unspecified: Secondary | ICD-10-CM | POA: Insufficient documentation

## 2023-01-25 DIAGNOSIS — I10 Essential (primary) hypertension: Secondary | ICD-10-CM | POA: Insufficient documentation

## 2023-01-25 DIAGNOSIS — Z96652 Presence of left artificial knee joint: Secondary | ICD-10-CM | POA: Diagnosis not present

## 2023-01-25 DIAGNOSIS — R059 Cough, unspecified: Secondary | ICD-10-CM | POA: Insufficient documentation

## 2023-01-25 DIAGNOSIS — R079 Chest pain, unspecified: Secondary | ICD-10-CM | POA: Diagnosis not present

## 2023-01-25 DIAGNOSIS — R051 Acute cough: Secondary | ICD-10-CM

## 2023-01-25 DIAGNOSIS — R109 Unspecified abdominal pain: Secondary | ICD-10-CM

## 2023-01-25 DIAGNOSIS — F17210 Nicotine dependence, cigarettes, uncomplicated: Secondary | ICD-10-CM | POA: Insufficient documentation

## 2023-01-25 LAB — URINALYSIS, ROUTINE W REFLEX MICROSCOPIC
Bilirubin Urine: NEGATIVE
Glucose, UA: NEGATIVE mg/dL
Hgb urine dipstick: NEGATIVE
Ketones, ur: NEGATIVE mg/dL
Leukocytes,Ua: NEGATIVE
Nitrite: NEGATIVE
Protein, ur: NEGATIVE mg/dL
Specific Gravity, Urine: 1.01 (ref 1.005–1.030)
pH: 5.5 (ref 5.0–8.0)

## 2023-01-25 LAB — COMPREHENSIVE METABOLIC PANEL
ALT: 22 U/L (ref 0–44)
AST: 18 U/L (ref 15–41)
Albumin: 3.6 g/dL (ref 3.5–5.0)
Alkaline Phosphatase: 55 U/L (ref 38–126)
Anion gap: 8 (ref 5–15)
BUN: 22 mg/dL — ABNORMAL HIGH (ref 6–20)
CO2: 25 mmol/L (ref 22–32)
Calcium: 9 mg/dL (ref 8.9–10.3)
Chloride: 106 mmol/L (ref 98–111)
Creatinine, Ser: 0.98 mg/dL (ref 0.61–1.24)
GFR, Estimated: >60 mL/min (ref >60)
Glucose, Bld: 98 mg/dL (ref 70–99)
Potassium: 3.7 mmol/L (ref 3.5–5.1)
Sodium: 139 mmol/L (ref 135–145)
Total Bilirubin: 0.5 mg/dL (ref <1.2)
Total Protein: 6.9 g/dL (ref 6.5–8.1)

## 2023-01-25 LAB — CBC
HCT: 42.4 % (ref 39.0–52.0)
Hemoglobin: 13.8 g/dL (ref 13.0–17.0)
MCH: 28.8 pg (ref 26.0–34.0)
MCHC: 32.5 g/dL (ref 30.0–36.0)
MCV: 88.3 fL (ref 80.0–100.0)
Platelets: 314 10*3/uL (ref 150–400)
RBC: 4.8 MIL/uL (ref 4.22–5.81)
RDW: 15.8 % — ABNORMAL HIGH (ref 11.5–15.5)
WBC: 9.1 10*3/uL (ref 4.0–10.5)
nRBC: 0 % (ref 0.0–0.2)

## 2023-01-25 LAB — TROPONIN I (HIGH SENSITIVITY)
Troponin I (High Sensitivity): 10 ng/L (ref <18)
Troponin I (High Sensitivity): 11 ng/L (ref <18)

## 2023-01-25 LAB — LIPASE, BLOOD: Lipase: 28 U/L (ref 11–51)

## 2023-01-25 MED ORDER — BENZONATATE 100 MG PO CAPS: 100.0000 mg | ORAL_CAPSULE | Freq: Three times a day (TID) | ORAL | 0 refills | Status: AC | PRN

## 2023-01-25 MED ORDER — IOHEXOL 350 MG/ML SOLN
100.0000 mL | Freq: Once | INTRAVENOUS | Status: AC | PRN
  Administered 2023-01-25: 100 mL via INTRAVENOUS

## 2023-01-25 MED ORDER — BENZONATATE 100 MG PO CAPS: 100.0000 mg | ORAL_CAPSULE | Freq: Three times a day (TID) | ORAL | 0 refills | Status: DC | PRN

## 2023-01-25 MED ORDER — DOXYCYCLINE HYCLATE 100 MG PO CAPS: 100.0000 mg | ORAL_CAPSULE | Freq: Two times a day (BID) | ORAL | 0 refills | Status: DC

## 2023-01-25 MED ORDER — MORPHINE SULFATE (PF) 4 MG/ML IV SOLN
4.0000 mg | Freq: Once | INTRAVENOUS | Status: AC
Start: 2023-01-25 — End: 2023-01-25
  Administered 2023-01-25: 4 mg via INTRAVENOUS
  Filled 2023-01-25: qty 1

## 2023-01-25 MED ORDER — DOXYCYCLINE HYCLATE 100 MG PO CAPS: 100.0000 mg | ORAL_CAPSULE | Freq: Two times a day (BID) | ORAL | 0 refills | Status: AC

## 2023-01-25 MED ORDER — IBUPROFEN 800 MG PO TABS: 800.0000 mg | ORAL_TABLET | Freq: Three times a day (TID) | ORAL | 0 refills | Status: AC | PRN

## 2023-01-25 MED ORDER — IBUPROFEN 800 MG PO TABS
800.0000 mg | ORAL_TABLET | Freq: Three times a day (TID) | ORAL | 0 refills | Status: DC | PRN
Start: 2023-01-25 — End: 2023-01-25

## 2023-01-25 NOTE — Discharge Instructions (Signed)
Get rechecked next week if not improving

## 2023-01-25 NOTE — ED Provider Notes (Signed)
[facility]-EMERGENCY DEPT [facility] Emergency Department Provider Note MRN:  [id number]  Arrival date & time: 01/25/23     Chief Complaint   Chest Pain   History of Present Illness   Justin [name] is a 52 y.o. year-old male with a history of hypertension presenting to the ED with chief complaint of chest pain.  Pain in the right lower chest/right upper quadrant abdomen constant for the past 2 days, getting worse and worse.  Worse with deep breaths, occasional cough productive of white sputum.  Episode of nausea vomiting today.  Denies fever, also has an abdominal hernia that is tender but less painful.  Review of Systems  A thorough review of systems was obtained and all systems are negative except as noted in the HPI and PMH.   Patient's Health History    Past Medical History:  Diagnosis Date   Arrhythmia    Arthritis    Both knees   Chicken pox    As a child   High cholesterol    Hypertension    on meds since 1998    Left ventricular hypertrophy    card work up [facility] ? inc QRS   Migraines    Triggered by stress/tension   OSA (obstructive sleep apnea)     Past Surgical History:  Procedure Laterality Date   CARPAL TUNNEL RELEASE     KNEE ARTHROPLASTY Left    left 2013 ligaments     Family History  Problem Relation Age of Onset   Hypertension Mother    Other Father        murdered    Breast cancer Maternal Grandmother    Breast cancer Maternal Aunt        x 3   Colon cancer Neg Hx     Social History   Socioeconomic History   Marital status: Married    Spouse name: Not on file   Number of children: Not on file   Years of education: Not on file   Highest education level: Not on file  Occupational History   Occupation: [id number]    Employer: [employer]    Comment: maintenance tech  Tobacco Use   Smoking status: Every Day    Current packs/day: 0.25    Average packs/day: 0.3 packs/day for 30.0 years (7.5 ttl pk-yrs)    Types: Cigarettes    Smokeless tobacco: Never   Tobacco comments:    tobacco info given 02/24/14  Substance and Sexual Activity   Alcohol use: Yes    Alcohol/week: 0.0 standard drinks of alcohol    Comment: rarely   Drug use: No    Comment: UDS 09/2016, no "current use" noted   Sexual activity: Not on file  Other Topics Concern   Not on file  Social History Narrative   8 hours of sleep per night   Lives with his girlfriend and step-daughter.   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   etoh  less than 76 per week.    Sodas  And sugar sodas  7-8 per day and then came down  To 1 day.    orig from [place]   Fa safety takes vitamins               Social Drivers  of Health   Financial Resource Strain: Not on file  Food Insecurity: Not on file  Transportation Needs: Not on file  Physical Activity: Not on file  Stress: Not on file  Social Connections: Not on file  Intimate Partner Violence: Not on file     Physical Exam   Vitals:   01/25/23 0440  BP: (!) 157/100  Pulse: 78  Resp: 18  Temp: 98.1 F (36.7 C)  SpO2: 94%    CONSTITUTIONAL: Chronically ill-appearing, NAD NEURO/PSYCH:  Alert and oriented x 3, no focal deficits EYES:  eyes equal and reactive ENT/NECK:  no LAD, no JVD CARDIO: Regular rate, well-perfused, normal S1 and S2 PULM:  CTAB no wheezing or rhonchi GI/GU:  non-distended, moderate right upper quadrant tenderness to palpation, mild periumbilical hernia tenderness MSK/SPINE:  No gross deformities, no edema SKIN:  no rash, atraumatic   *Additional and/or pertinent findings included in MDM below  Diagnostic and Interventional Summary    EKG Interpretation Date/Time:  Friday January 25 2023 05:20:57 EST Ventricular Rate:  69 PR Interval:  162 QRS Duration:  131 QT Interval:  425 QTC Calculation: 456 R  Axis:   -18  Text Interpretation: Sinus rhythm Right bundle branch block Minimal ST elevation, anterior leads Confirmed by Kennis Carina [id number]) on 01/25/2023 5:26:26 AM       Labs Reviewed  CBC - Abnormal; Notable for the following components:      Result Value   RDW 15.8 (*)    All other components within normal limits  COMPREHENSIVE METABOLIC PANEL - Abnormal; Notable for the following components:   BUN 22 (*)    All other components within normal limits  LIPASE, BLOOD  URINALYSIS, ROUTINE W REFLEX MICROSCOPIC  TROPONIN I (HIGH SENSITIVITY)  TROPONIN I (HIGH SENSITIVITY)    CT ABDOMEN PELVIS W CONTRAST  Final Result    CT Angio Chest Pulmonary Embolism (PE) W or WO Contrast  Final Result    DG Chest Port 1 View  Final Result      Medications  morphine (PF) 4 MG/ML injection 4 mg (4 mg Intravenous Given 01/25/23 0531)  iohexol (OMNIPAQUE) 350 MG/ML injection 100 mL (100 mLs Intravenous Contrast Given 01/25/23 0604)     Procedures  /  Critical Care Procedures  ED Course and Medical Decision Making  Initial Impression and Ddx Differential diagnosis includes PE, cholecystitis, kidney stone, MSK, pneumothorax  Past medical/surgical history that increases complexity of ED encounter: None  Interpretation of Diagnostics I personally reviewed the EKG and my interpretation is as follows: Sinus rhythm  Labs overall reassuring with no significant blood count or electrolyte disturbance.  Troponin negative x 1.  Patient Reassessment and Ultimate Disposition/Management     Awaiting second troponin, urinalysis.  CT imaging is without acute process.  Signed out to oncoming provider.  Patient management required discussion with the following services or consulting groups:  None  Complexity of Problems Addressed Acute illness or injury that poses threat of life of bodily function  Additional Data Reviewed and Analyzed Further history obtained from: None  Additional  Factors Impacting ED Encounter Risk Consideration of hospitalization  Elmer Sow. Pilar Plate, MD [facility] Emergency Medicine [facility] [email or web address]  Final Clinical Impressions(s) / ED Diagnoses     ICD-10-CM   1. Flank pain  R10.9       ED Discharge Orders     None        Discharge Instructions Discussed with and Provided to Patient:  Discharge Instructions   None      Sabas Sous, MD 01/25/23 [phone]

## 2023-01-25 NOTE — ED Provider Notes (Signed)
CBC chemistries urinalysis and CT chest and abdomen are unremarkable.  Patient still complains of a productive cough and pain on the right chest.  He is sent home with doxycycline Tessalon Perles and Motrin and will follow-up if not improving   Bethann Berkshire, MD 01/25/23 [phone]

## 2023-01-25 NOTE — ED Triage Notes (Signed)
Pt c/o right side rib pain x 2 days and states the pain is worse with coughing or lying down;    [facility] toradol IM which relieved the pain  Pt states he has been coughing up some white sputum

## 2023-01-25 NOTE — ED Notes (Signed)
[facility]ed [facility] to and was told that the provider (Dr.) needs to call and give report. Forwarded information to EDP.

## 2023-01-25 NOTE — ED Notes (Signed)
Pt back from CT
